# Patient Record
Sex: Female | Born: 1945
Health system: Southern US, Community
[De-identification: ages and names within clinical notes are randomized; demographics above are authoritative.]

## PROBLEM LIST (undated history)

## (undated) DIAGNOSIS — M1990 Unspecified osteoarthritis, unspecified site: Secondary | ICD-10-CM

## (undated) DIAGNOSIS — Z853 Personal history of malignant neoplasm of breast: Secondary | ICD-10-CM

## (undated) DIAGNOSIS — Z9889 Other specified postprocedural states: Secondary | ICD-10-CM

## (undated) DIAGNOSIS — R112 Nausea with vomiting, unspecified: Secondary | ICD-10-CM

## (undated) HISTORY — PX: TOTAL HIP ARTHROPLASTY: SHX124

## (undated) HISTORY — PX: APPENDECTOMY: SHX54

---

## 1997-07-12 ENCOUNTER — Encounter: Admission: RE | Admit: 1997-07-12 | Discharge: 1997-07-12 | Payer: Self-pay | Admitting: Family Medicine

## 1997-07-24 ENCOUNTER — Encounter: Admission: RE | Admit: 1997-07-24 | Discharge: 1997-07-24 | Payer: Self-pay | Admitting: Family Medicine

## 1999-01-06 ENCOUNTER — Encounter: Admission: RE | Admit: 1999-01-06 | Discharge: 1999-01-06 | Payer: Self-pay | Admitting: Family Medicine

## 1999-01-07 ENCOUNTER — Encounter: Payer: Self-pay | Admitting: Family Medicine

## 1999-01-07 ENCOUNTER — Encounter: Admission: RE | Admit: 1999-01-07 | Discharge: 1999-01-07 | Payer: Self-pay | Admitting: Family Medicine

## 1999-01-16 ENCOUNTER — Other Ambulatory Visit: Admission: RE | Admit: 1999-01-16 | Discharge: 1999-01-16 | Payer: Self-pay | Admitting: Family Medicine

## 1999-01-16 ENCOUNTER — Encounter: Admission: RE | Admit: 1999-01-16 | Discharge: 1999-01-16 | Payer: Self-pay | Admitting: Family Medicine

## 1999-02-13 ENCOUNTER — Encounter: Admission: RE | Admit: 1999-02-13 | Discharge: 1999-02-13 | Payer: Self-pay | Admitting: Family Medicine

## 1999-05-15 ENCOUNTER — Encounter: Admission: RE | Admit: 1999-05-15 | Discharge: 1999-05-15 | Payer: Self-pay | Admitting: Family Medicine

## 1999-07-31 ENCOUNTER — Encounter: Admission: RE | Admit: 1999-07-31 | Discharge: 1999-07-31 | Payer: Self-pay | Admitting: Family Medicine

## 2000-02-12 ENCOUNTER — Encounter: Admission: RE | Admit: 2000-02-12 | Discharge: 2000-02-12 | Payer: Self-pay | Admitting: Family Medicine

## 2000-02-12 ENCOUNTER — Encounter: Admission: RE | Admit: 2000-02-12 | Discharge: 2000-02-12 | Payer: Self-pay | Admitting: Sports Medicine

## 2000-02-12 ENCOUNTER — Encounter: Payer: Self-pay | Admitting: Sports Medicine

## 2000-03-01 ENCOUNTER — Encounter: Payer: Self-pay | Admitting: Family Medicine

## 2000-03-01 ENCOUNTER — Encounter: Admission: RE | Admit: 2000-03-01 | Discharge: 2000-03-01 | Payer: Self-pay | Admitting: Family Medicine

## 2000-09-19 ENCOUNTER — Encounter: Admission: RE | Admit: 2000-09-19 | Discharge: 2000-09-19 | Payer: Self-pay | Admitting: Family Medicine

## 2000-10-14 ENCOUNTER — Encounter (INDEPENDENT_AMBULATORY_CARE_PROVIDER_SITE_OTHER): Payer: Self-pay | Admitting: *Deleted

## 2000-10-19 ENCOUNTER — Encounter: Admission: RE | Admit: 2000-10-19 | Discharge: 2000-10-19 | Payer: Self-pay | Admitting: Family Medicine

## 2002-03-28 ENCOUNTER — Encounter: Admission: RE | Admit: 2002-03-28 | Discharge: 2002-03-28 | Payer: Self-pay | Admitting: Family Medicine

## 2002-04-11 ENCOUNTER — Encounter: Admission: RE | Admit: 2002-04-11 | Discharge: 2002-04-11 | Payer: Self-pay | Admitting: Family Medicine

## 2002-05-02 ENCOUNTER — Encounter: Admission: RE | Admit: 2002-05-02 | Discharge: 2002-05-02 | Payer: Self-pay | Admitting: Family Medicine

## 2002-05-14 ENCOUNTER — Encounter: Payer: Self-pay | Admitting: Family Medicine

## 2002-05-14 ENCOUNTER — Encounter: Admission: RE | Admit: 2002-05-14 | Discharge: 2002-05-14 | Payer: Self-pay | Admitting: Family Medicine

## 2002-08-01 ENCOUNTER — Encounter: Payer: Self-pay | Admitting: Emergency Medicine

## 2002-08-01 ENCOUNTER — Emergency Department (HOSPITAL_COMMUNITY): Admission: EM | Admit: 2002-08-01 | Discharge: 2002-08-01 | Payer: Self-pay | Admitting: Emergency Medicine

## 2003-05-12 ENCOUNTER — Emergency Department (HOSPITAL_COMMUNITY): Admission: EM | Admit: 2003-05-12 | Discharge: 2003-05-12 | Payer: Self-pay | Admitting: Emergency Medicine

## 2003-05-31 ENCOUNTER — Encounter: Admission: RE | Admit: 2003-05-31 | Discharge: 2003-05-31 | Payer: Self-pay | Admitting: Sports Medicine

## 2003-07-12 ENCOUNTER — Encounter: Admission: RE | Admit: 2003-07-12 | Discharge: 2003-07-12 | Payer: Self-pay | Admitting: Sports Medicine

## 2003-08-13 ENCOUNTER — Encounter: Admission: RE | Admit: 2003-08-13 | Discharge: 2003-08-13 | Payer: Self-pay | Admitting: Sports Medicine

## 2003-11-01 ENCOUNTER — Encounter: Admission: RE | Admit: 2003-11-01 | Discharge: 2003-11-01 | Payer: Self-pay | Admitting: Sports Medicine

## 2004-02-14 ENCOUNTER — Ambulatory Visit: Payer: Self-pay | Admitting: Sports Medicine

## 2004-04-09 ENCOUNTER — Ambulatory Visit: Payer: Self-pay | Admitting: Sports Medicine

## 2005-07-27 ENCOUNTER — Ambulatory Visit: Payer: Self-pay | Admitting: Sports Medicine

## 2005-09-07 ENCOUNTER — Ambulatory Visit: Payer: Self-pay | Admitting: Family Medicine

## 2005-11-09 ENCOUNTER — Ambulatory Visit: Payer: Self-pay | Admitting: Sports Medicine

## 2006-05-12 DIAGNOSIS — K5732 Diverticulitis of large intestine without perforation or abscess without bleeding: Secondary | ICD-10-CM | POA: Insufficient documentation

## 2006-05-12 DIAGNOSIS — M1990 Unspecified osteoarthritis, unspecified site: Secondary | ICD-10-CM | POA: Insufficient documentation

## 2006-05-12 DIAGNOSIS — F329 Major depressive disorder, single episode, unspecified: Secondary | ICD-10-CM | POA: Insufficient documentation

## 2006-05-12 DIAGNOSIS — F411 Generalized anxiety disorder: Secondary | ICD-10-CM | POA: Insufficient documentation

## 2006-05-12 DIAGNOSIS — F3289 Other specified depressive episodes: Secondary | ICD-10-CM | POA: Insufficient documentation

## 2006-05-12 DIAGNOSIS — N951 Menopausal and female climacteric states: Secondary | ICD-10-CM | POA: Insufficient documentation

## 2006-05-13 ENCOUNTER — Encounter (INDEPENDENT_AMBULATORY_CARE_PROVIDER_SITE_OTHER): Payer: Self-pay | Admitting: *Deleted

## 2009-05-09 ENCOUNTER — Encounter: Admission: RE | Admit: 2009-05-09 | Discharge: 2009-05-09 | Payer: Self-pay | Admitting: Family Medicine

## 2009-10-03 ENCOUNTER — Encounter: Admission: RE | Admit: 2009-10-03 | Discharge: 2009-10-03 | Payer: Self-pay | Admitting: Family Medicine

## 2009-11-19 ENCOUNTER — Ambulatory Visit: Payer: Self-pay | Admitting: Sports Medicine

## 2009-11-19 DIAGNOSIS — M19079 Primary osteoarthritis, unspecified ankle and foot: Secondary | ICD-10-CM | POA: Insufficient documentation

## 2009-12-03 ENCOUNTER — Encounter: Payer: Self-pay | Admitting: Sports Medicine

## 2009-12-24 ENCOUNTER — Ambulatory Visit: Payer: Self-pay | Admitting: Sports Medicine

## 2010-01-07 ENCOUNTER — Encounter: Payer: Self-pay | Admitting: Sports Medicine

## 2010-03-19 ENCOUNTER — Ambulatory Visit
Admission: RE | Admit: 2010-03-19 | Discharge: 2010-03-19 | Payer: Self-pay | Source: Home / Self Care | Attending: Sports Medicine | Admitting: Sports Medicine

## 2010-03-19 DIAGNOSIS — M7500 Adhesive capsulitis of unspecified shoulder: Secondary | ICD-10-CM | POA: Insufficient documentation

## 2010-03-19 DIAGNOSIS — M19019 Primary osteoarthritis, unspecified shoulder: Secondary | ICD-10-CM | POA: Insufficient documentation

## 2010-04-16 NOTE — Letter (Signed)
Summary: [facility]   [facility]   Imported By: Marily Memos 12/08/2009 13:31:20  _____________________________________________________________________  External Attachment:    Type:   Image     Comment:   External Document
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
## 2010-04-16 NOTE — Assessment & Plan Note (Signed)
Summary: f/u ankle,mc   Vital Signs:  Patient profile:   65 year old female Pulse rate:   86 / minute BP sitting:   138 / 94  (right arm)  Vitals Entered By: Rochele Pages RN (December 24, 2009 9:50 AM) CC: f/u rt ankle pain   CC:  f/u rt ankle pain.  History of Present Illness: Patient returns to clinic today to f/u on rt ankle pain.  Pain continues, hurting siginificantly today.   Wearing brace at all times except for while sleeping. Taking tramadol as needed, but states she does not feel this is very effective.   walking is difficult rainy days cause more ankle pain  She is willing to go to [facility] for opinion regarding ankle replacement or other surgery  Physical Exam  General:  Well-developed,well-nourished,in no acute distress; alert,appropriate and cooperative throughout examination Msk:  Rt ankle exam- Inversion normal Eversion lacking 10-15 degrees Plantar flexion 10 degree contracture, only 5 degrees additional platar flexion with pressure no dorsiflexion active or passive  cavus shape to foot overall  Gait shows dynamic genu valgus of RT leg walks with limited ankle motion almost as if fused gait does improve after walking for 2 to 3 minutes    Impression & Recommendations:  Problem # 1:  DEGENERATIVE JOINT DISEASE, ANKLE (ICD-715.97)  Her updated medication list for this problem includes:    Tramadol Hcl 50 Mg Tabs (Tramadol hcl) .Marland Kitchen... 1 tab two times a day to qid as needed pain  I would like her to see Dr Rosanne Ashing Diorio at [facility] for consideration of ankle replacement or other surgical options She remains very active and I think has potential for improvement in her activity level and her ADLs She has been a good athlete in younger years and rehabbed very well after RT hip replacement at [facility] I think whe will be motivated to work with them  Can return to me on a periodic basis for medical and other management  Complete Medication List: 1)  Tramadol Hcl 50 Mg  Tabs (Tramadol hcl) .Marland Kitchen.. 1 tab two times a day to qid as needed pain  Patient Instructions: 1)  Continue using tramadol at least twice daily, and as needed 2)  Try taking 2-9 grams of devil's claw. 3)  Discuss with orthopedic surgeon ankle fusion vs replacement. 4)  Do hip exercises regularly

## 2010-04-16 NOTE — Letter (Signed)
Summary: *Referral Letter  [facility]  [address]   [place], [place] [address]   Phone: [phone]  Fax: [phone]    12/24/2009 Dr. Fayrene Fearing Diorio [facility]   Dear Dr. Gates Rigg:  Thank you in advance for agreeing to see my patient:  [facility] [address] [place], [place]  [address]  Phone: [phone]  Reason for Referral: Teea is an active women who was very athletic in younger years.  She did gymnastics and other sports.  Both sons professional tennis players.  She had a lateral malleolar fracture that was never adequately treated and now has severe DJD of RT ankle that is limiting her life activity.  Procedures Requested: Your opinion of her as a candidate for Ankle replacement or other surgery for RT ankle.  Current Medical Problems: 1)  DEGENERATIVE JOINT DISEASE, ANKLE (ICD-715.97) 2)  MENOPAUSAL SYNDROME (ICD-627.2) 3)  DJD, UNSPECIFIED (ICD-715.90) - S/P replacment of RT Hip at [facility] 4)  DIVERTICULITIS OF COLON, NOS (ICD-562.11) 5)  DEPRESSIVE DISORDER, NOS (ICD-311) 6)  ANXIETY (ICD-300.00)   Current Medications: 1)  TRAMADOL HCL 50 MG  TABS (TRAMADOL HCL) 1 tab two to four times daily as needed  Past Medical History: 1)  chondromalacia of R hip s/p replacement, Dupetryn`s Contracture R 4th, fracture of rt lateral malleolus 2006/2007  Thank you again for agreeing to see our patient; please contact us if you have any further questions or need additional information.  Sincerely,   Army Melia FIELDS MD [phone].

## 2010-04-16 NOTE — Assessment & Plan Note (Signed)
Summary: PRE SURGERY DISCUSSION/MJD   Vital Signs:  Patient profile:   65 year old female BP sitting:   150 / 100  Vitals Entered By: Lillia Pauls CMA (March 19, 2010 2:50 PM)  CC:  ankle and neck pain.  History of Present Illness: ankle problems- right side, broken 5 years ago.  Did not have any ankle surgery at time of break.  NOw limitations of mobility and pain that is worse at the end of the day and when she sits edown after a long day.  uses ice and aspirin for pain.  tramadol didnt help.  using aircast all the time.    Seen at [facility] for consideration for ankle replacement.  Pt wants to discuss this with Dr. Darrick Penna.  neck- steadily getting worse, numbness down left arm.  worse at night.  now gets a pain where she was bitten by an insect. no weakness in hands unless lying on shoulder.   now gets left shoulder locking on full elevation or forward flexion  seen at [facility] and told that she had severe DJD changes in shoulder and mod ones in cervical spine  Pain at night in both locations that interrupts sleep.  Current Medications (verified): 1)  Tramadol Hcl 50 Mg  Tabs (Tramadol Hcl) .Marland Kitchen.. 1 Tab Two Times A Day To Qid As Needed Pain 2)  Vicodin 5-500 Mg Tabs (Hydrocodone-Acetaminophen) .... Take 1 By Mouth Two Times A Day  Allergies (verified): No Known Drug Allergies  Review of Systems  The patient denies fever, weight loss, and chest pain.    Physical Exam  General:  NADwell-developed, well-nourished, and well-hydrated.   Msk:  Neck: Full neck range of motion Grip strength and sensation normal in bilateral hands Strength good C4 to T1 distribution No sensory change to C4 to T1  left Shoulder: Inspection reveals no abnormalities, atrophy or asymmetry. Palpation is normal with no tenderness over AC joint or bicipital groove. ROM is limited to 115 degreesabduction, 100 anteriorly Rotator cuff strength normal throughout. No signs of impingement with negative Neer and  Hawkin's tests, empty can. Speeds and Yergason's tests normal. Normal scapular function observed. No painful arc and no drop arm sign. No apprehension sign   Rt ankle exam- Inversion normal Eversion lacking 10-15 degrees Plantar flexion 10 degree contracture, only 5 degrees additional platar flexion with pressure no dorsiflexion active or passive obvious deformity of lat malleolus     Additional Exam:  Xrays of shoulder large inf spur off humeral head loss of GH joint space/ sclerosis Left  Neck mild loss of disk space sclerosis C4 to C7   Impression & Recommendations:  Problem # 1:  DEGENERATIVE JOINT DISEASE, ANKLE (ICD-715.97) Assessment Unchanged discussed ankle replacement at [facility].  Recomend that she wait until she feels ready/ has the support she needs.  Vicodin for pain. Her updated medication list for this problem includes:    Tramadol Hcl 50 Mg Tabs (Tramadol hcl) .Marland Kitchen... 1 tab two times a day to qid as needed pain    Vicodin 5-500 Mg Tabs (Hydrocodone-acetaminophen) .Marland Kitchen... Take 1 by mouth two times a day  Problem # 2:  DEGENERATIVE JOINT DISEASE, LEFT SHOULDER (ICD-715.91) Assessment: New shoulder x ray shows bone spur and OA in joint.  May need shoulder replacement in future.  Will try to control pain with vicodin and amytriptiline at bedtime.  If this is not sufficient will consider shoulder injection at next visit. Her updated medication list for this problem includes:  Tramadol Hcl 50 Mg Tabs (Tramadol hcl) .Marland Kitchen... 1 tab two times a day to qid as needed pain    Vicodin 5-500 Mg Tabs (Hydrocodone-acetaminophen) .Marland Kitchen... Take 1 by mouth two times a day  Problem # 3:  FROZEN LEFT SHOULDER (ICD-726.0) Assessment: New provided ROM exercises for pt.  Pain medication for improved sleep at night.    reck 1 mo  Complete Medication List: 1)  Tramadol Hcl 50 Mg Tabs (Tramadol hcl) .Marland Kitchen.. 1 tab two times a day to qid as needed pain 2)  Vicodin 5-500 Mg Tabs  (Hydrocodone-acetaminophen) .... Take 1 by mouth two times a day  Patient Instructions: 1)  Work on range of motion exercises for shoulder 2)  take vicodin at night 3)  take 25mg  of amytriptiline at night 4)  wait for your joint replacements until you are ready, no harm in waiting Prescriptions: VICODIN 5-500 MG TABS (HYDROCODONE-ACETAMINOPHEN) take 1 by mouth two times a day  #60 x 0   Entered by:   Rochele Pages RN   Authorized by:   Enid Baas MD   Signed by:   Rochele Pages RN on 03/19/2010   Method used:   Print then Give to Patient   RxID:   [id number]    Orders Added: 1)  Est. Patient Level IV [14782]

## 2010-04-16 NOTE — Consult Note (Signed)
Summary: [facility]  [facility]   Imported By: Marily Memos 01/21/2010 09:13:38  _____________________________________________________________________  External Attachment:    Type:   Image     Comment:   External Document

## 2010-06-16 ENCOUNTER — Other Ambulatory Visit: Payer: Self-pay | Admitting: *Deleted

## 2010-06-16 MED ORDER — HYDROCODONE-ACETAMINOPHEN 5-500 MG PO TABS: ORAL_TABLET | ORAL | Status: DC

## 2010-07-01 ENCOUNTER — Ambulatory Visit (INDEPENDENT_AMBULATORY_CARE_PROVIDER_SITE_OTHER): Payer: BC Managed Care – HMO | Admitting: Sports Medicine

## 2010-07-01 ENCOUNTER — Encounter: Payer: Self-pay | Admitting: Sports Medicine

## 2010-07-01 DIAGNOSIS — M19019 Primary osteoarthritis, unspecified shoulder: Secondary | ICD-10-CM

## 2010-07-01 DIAGNOSIS — M7500 Adhesive capsulitis of unspecified shoulder: Secondary | ICD-10-CM

## 2010-07-01 DIAGNOSIS — M19079 Primary osteoarthritis, unspecified ankle and foot: Secondary | ICD-10-CM

## 2010-07-01 NOTE — Assessment & Plan Note (Signed)
See injection as above ROM tolerated Ok for vicodin as needed

## 2010-07-01 NOTE — Assessment & Plan Note (Addendum)
CSI today - ROM as tolerated - may need replacement at some point - avoid wt bearing overhead activites  Consent obtained and verified. Sterile betadine prep. Furthur cleansed with alcohol. Topical analgesic spray: Ethyl chloride. Joint: Lt GH Approached in typical fashion with:posterior aimed at coracoid Completed without difficulty Meds:1 cc kenalog 40 mg and 7 cc lidocaine 1% Needle:25 G 1.5 inch Aftercare instructions and Red flags advised.

## 2010-07-01 NOTE — Progress Notes (Signed)
  Subjective:    Patient ID: Kelly Hancock "Kelly Hancock, female    DOB: 1945/09/12, 65 y.o.   MRN: [id number]  HPI F/u Rt ankle and Lt shoulder  Rt ankle - pain is a little better.  Using brace some.  Has had to reschedule replacement with [facility] physician. Still unsure if insurance will cover everything.  Lt shoulder - Has some GH arthritis with possible frozen shoulder component.  Causing night time pain.  Has worked on ROM exercises.  Would like CSI today.   Review of Systems No F/S/C    Objective:   Physical Exam Gen: NAD Rt ankle: Severely limited ROM with dorsaflexion/plantarflexion/eversion/inversion.  + heterotopic bone/spurring on lateral ankle.  No obvious effusion today. Lt shoulder: ROM 150 deg f flexion and abduction. IR to buttock only, ER 30 deg.  + pain with ER and frozen shoudler test.  + popping and crepitus with all ROM.  RC strength intact.       Assessment & Plan:

## 2010-07-01 NOTE — Assessment & Plan Note (Signed)
-   vicodin as needed for pain - Gentle ROM as tolerated - f/u [facility] when ready for replacement

## 2010-07-28 ENCOUNTER — Ambulatory Visit (INDEPENDENT_AMBULATORY_CARE_PROVIDER_SITE_OTHER): Payer: BC Managed Care – HMO | Admitting: Family Medicine

## 2010-07-28 VITALS — BP 123/86 | HR 85

## 2010-07-28 DIAGNOSIS — M24419 Recurrent dislocation, unspecified shoulder: Secondary | ICD-10-CM

## 2010-07-28 DIAGNOSIS — M19019 Primary osteoarthritis, unspecified shoulder: Secondary | ICD-10-CM

## 2010-07-28 DIAGNOSIS — M25519 Pain in unspecified shoulder: Secondary | ICD-10-CM

## 2010-07-28 MED ORDER — DIAZEPAM 5 MG PO TABS: 5.0000 mg | ORAL_TABLET | Freq: Three times a day (TID) | ORAL | Status: AC | PRN

## 2010-07-28 NOTE — Patient Instructions (Addendum)
Sling for 2 weeks  Go to xray  Referral to Dr. Ave Filter, Shoulder surgeon on Wednesday , Aug 05, 2010 at 1030.  [address] [place], [place] [phone]

## 2010-07-29 ENCOUNTER — Other Ambulatory Visit: Payer: Self-pay | Admitting: Family Medicine

## 2010-07-29 ENCOUNTER — Ambulatory Visit
Admission: RE | Admit: 2010-07-29 | Discharge: 2010-07-29 | Disposition: A | Discharge status: Home / Self Care | Payer: BC Managed Care – HMO | Source: Ambulatory Visit | Attending: Family Medicine | Admitting: Family Medicine

## 2010-07-29 ENCOUNTER — Ambulatory Visit: Admission: RE | Admit: 2010-07-29 | Payer: BC Managed Care – HMO | Source: Ambulatory Visit

## 2010-07-29 DIAGNOSIS — M25519 Pain in unspecified shoulder: Secondary | ICD-10-CM

## 2010-07-29 DIAGNOSIS — M24419 Recurrent dislocation, unspecified shoulder: Secondary | ICD-10-CM | POA: Insufficient documentation

## 2010-07-29 NOTE — Progress Notes (Signed)
65 year old female who I am asked to see acutely who "threw her shoulder out" last night and has not been able to reduce on her own. Patient and her family are well known to the [facility] staff. She is a former highly Publishing copy with significant congenital laxity, end stage ankle arthritis, who recently had multi-plane loss of motion c/w adhesive capsulitis on the L and had an intraarticular injection of corticosteroid 4-5 weeks ago.  Since then, her ROM has improved and she has had notable improved ROM until yesterday, DOI 07/27/2010.  Per her report, shoulder had repetitively subluxed many times throughout life, but additionally now there "is a crunching sound".  Today, she is bent over, arm bent at 90 deg at elbow, protecting L arm.  Patient Active Problem List  Diagnoses  . ANXIETY  . DEPRESSIVE DISORDER, NOS  . DIVERTICULITIS OF COLON, NOS  . MENOPAUSAL SYNDROME  . DJD, UNSPECIFIED  . DEGENERATIVE JOINT DISEASE, ANKLE  . DEGENERATIVE JOINT DISEASE, LEFT SHOULDER  . FROZEN LEFT SHOULDER  . Shoulder dislocation, recurrent   No past medical history on file. No past surgical history on file. History  Substance Use Topics  . Smoking status: Never Smoker   . Smokeless tobacco: Not on file  . Alcohol Use: Not on file   No family history on file. Allergies  Allergen Reactions  . Demerol    Current Outpatient Prescriptions on File Prior to Visit  Medication Sig Dispense Refill  . HYDROcodone-acetaminophen (VICODIN) 5-500 MG per tablet 1 tab two times a day to qid as needed pain  30 tablet  0   REVIEW OF SYSTEMS  GEN: No fevers, chills. Nontoxic. Primarily MSK c/o today. MSK: Detailed in the HPI GI: tolerating PO intake without difficulty Otherwise the pertinent positives of the ROS are noted above.   GEN: WDWN, NAD, appears in acute pain. HEENT: Atraumatic, Normocephalic. Neck supple. No masses, No LAD. Ears and Nose: No external deformity. EXTR: No c/c/e NEURO Normal  gait.  PSYCH: Normally interactive. Conversant. Not depressed or anxious appearing.   R shoulder full ROM  L shoulder. NT along clavicle. Anterior, glenohumeral head is palpated in anterior local compared to contralateral side. Posterior loss of congruity is present. Minimally able to flex or abduct the shoulder. Sensation and grip are intact.  After manipulation, in prone position, holding GH head in place, flexion, abduction appreciated with good integrity of GH location.  A/P: 1. Severe OA L GH joint 2. Dislocation, L shoulder: by feel, as if subluxed without reduction and without a great deal of anterior translation of GH head  Manipulation, L shoulder Anesthesia: Prepped with betadine, ethyl chloride for anesthesia. 10 cc of Lidocaine 1% instilled into the [facility] joint via posterior approach with good effect. Diminished pain post-injection. Patient subsequently placed prone on exam table with traction placed on arm. Translation and shift felt at anterior displacement with good relocation and dramatically improved ROM post reduction.  Placed in sling - 2 weeks  Complex shoulder case, check xrays  At this time, significant GH OA. I am going to consult Dr. Ave Filter for his expertise in this case.

## 2010-10-01 ENCOUNTER — Other Ambulatory Visit: Payer: Self-pay | Admitting: *Deleted

## 2010-10-01 MED ORDER — HYDROCODONE-ACETAMINOPHEN 5-500 MG PO TABS: ORAL_TABLET | ORAL | Status: DC

## 2011-04-30 ENCOUNTER — Other Ambulatory Visit: Payer: Self-pay | Admitting: Family Medicine

## 2011-04-30 DIAGNOSIS — Z780 Asymptomatic menopausal state: Secondary | ICD-10-CM

## 2011-04-30 DIAGNOSIS — Z1231 Encounter for screening mammogram for malignant neoplasm of breast: Secondary | ICD-10-CM

## 2011-07-21 ENCOUNTER — Other Ambulatory Visit: Payer: BC Managed Care – HMO

## 2011-07-21 ENCOUNTER — Ambulatory Visit: Payer: BC Managed Care – HMO

## 2011-09-08 ENCOUNTER — Ambulatory Visit: Payer: BC Managed Care – HMO

## 2011-09-08 ENCOUNTER — Other Ambulatory Visit: Payer: BC Managed Care – HMO

## 2011-10-12 ENCOUNTER — Encounter: Payer: Self-pay | Admitting: Sports Medicine

## 2011-10-12 ENCOUNTER — Ambulatory Visit (INDEPENDENT_AMBULATORY_CARE_PROVIDER_SITE_OTHER): Payer: BC Managed Care – HMO | Admitting: Sports Medicine

## 2011-10-12 VITALS — BP 150/101 | HR 89

## 2011-10-12 DIAGNOSIS — M19019 Primary osteoarthritis, unspecified shoulder: Secondary | ICD-10-CM

## 2011-10-12 DIAGNOSIS — M19079 Primary osteoarthritis, unspecified ankle and foot: Secondary | ICD-10-CM

## 2011-10-12 MED ORDER — HYDROCODONE-ACETAMINOPHEN 5-500 MG PO TABS: ORAL_TABLET | ORAL | Status: DC

## 2011-10-12 MED ORDER — DIAZEPAM 5 MG PO TABS: 5.0000 mg | ORAL_TABLET | Freq: Three times a day (TID) | ORAL | Status: DC | PRN

## 2011-10-12 NOTE — Assessment & Plan Note (Signed)
We gave her a compression sleeve today to use for her ankle to keep down the swelling  Restart hydrocodone 5/500  Go ahead and use this 2-3 times a day as she only used 30 in the last year  I think this would make her more capable of walking  I will see her in 6 weeks

## 2011-10-12 NOTE — Assessment & Plan Note (Signed)
Shoulder limitation seems extreme  I think she would need a surgical shoulder replacement to improve this  At present we will give her diazepam 5 mg to take 1 or 2 at night to see if she can sleep through the night without spasm  I will follow until she wants surgery
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
## 2011-11-17 ENCOUNTER — Ambulatory Visit (INDEPENDENT_AMBULATORY_CARE_PROVIDER_SITE_OTHER): Payer: BC Managed Care – HMO | Admitting: Sports Medicine

## 2011-11-17 VITALS — BP 146/80

## 2011-11-17 DIAGNOSIS — M19079 Primary osteoarthritis, unspecified ankle and foot: Secondary | ICD-10-CM

## 2011-11-17 DIAGNOSIS — M7500 Adhesive capsulitis of unspecified shoulder: Secondary | ICD-10-CM

## 2011-11-17 MED ORDER — HYDROCODONE-ACETAMINOPHEN 5-500 MG PO TABS: ORAL_TABLET | ORAL | Status: DC

## 2011-11-17 MED ORDER — AMITRIPTYLINE HCL 25 MG PO TABS: 25.0000 mg | ORAL_TABLET | Freq: Every day | ORAL | Status: DC

## 2011-11-17 NOTE — Patient Instructions (Addendum)
Please start amitriptyline 25 mg at bedtime  Take hydrocodone twice daily  Ok to continue diazepam at bedtime  Please follow up in 3 months  Thank you for seeing [place] today!
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
## 2011-11-17 NOTE — Assessment & Plan Note (Signed)
She has seen Dr Ave Filter  Probably a candidate for reconstructive surgery  Cont Valium for relaxation of mm spasm Add amitriptyline to potentiate pain relief  Reck 3 mos  I think surgery is not a good option at this time because of other family stressors

## 2011-11-17 NOTE — Assessment & Plan Note (Signed)
This remains severe but her gait is improves with [employer] shoes  Cont to use compression most of time as swelling is improved  I think she needs to use more pain med to be functional

## 2012-03-06 ENCOUNTER — Encounter: Payer: Self-pay | Admitting: Sports Medicine

## 2012-03-06 ENCOUNTER — Ambulatory Visit (INDEPENDENT_AMBULATORY_CARE_PROVIDER_SITE_OTHER): Payer: BC Managed Care – HMO | Admitting: Sports Medicine

## 2012-03-06 VITALS — BP 144/84 | Ht 65.0 in

## 2012-03-06 DIAGNOSIS — M19019 Primary osteoarthritis, unspecified shoulder: Secondary | ICD-10-CM

## 2012-03-06 DIAGNOSIS — M19079 Primary osteoarthritis, unspecified ankle and foot: Secondary | ICD-10-CM

## 2012-03-06 MED ORDER — DIAZEPAM 5 MG PO TABS: 5.0000 mg | ORAL_TABLET | Freq: Three times a day (TID) | ORAL | Status: DC | PRN

## 2012-03-06 NOTE — Assessment & Plan Note (Signed)
Continue air cast with activity, followed by compression sleeve for a few hours after activity. Continue anti-inflammatory medication. Evaluated by [facility] in the past, but no surgery at this time. Support is key for her, and she seems to be doing well with that.

## 2012-03-06 NOTE — Patient Instructions (Signed)
Continue to do balance exercises alternating feet. Continue to wear your Aircast with a lot of walking/standing. Use the compression sleeve for about an hour afterwards. Easy motion with ankle without forcing it past any real restrictions.  For your shoulder, please restart the Amitriptyline nightly to help with sleep.

## 2012-03-06 NOTE — Progress Notes (Signed)
  Subjective:    Patient ID: Kelly Hancock, female    DOB: 1945-12-09, 66 y.o.   MRN: [id number]  HPI  Pt follow up for left shoulder pain and right ankle pain  1. Shoulder- Patient with frozen shoulder likely secondary to arthritis. She was evaluted by Dr. Ave Filter at [facility] but at this time she does not feel that surgery is right for her. She has been doing ROM exercises and has improved her ROM. She also takes Valium at night to sleep, but not the Amitriptyline because she drinks wine nightly and did not want it to interact.  2. Ankle- Patient reports the ankle has not worsened, except she had an incident this morning where she stepped off the porch wrong and her ankle twisted. She had on her air cast which protected her some but she still feels a little discomfort. She typically wears the air cast or compression sleeve for comfort. She takes some ASA which helps with this imflammation.    Review of Systems Negative except as mentioned in HPI above    Objective:   Physical Exam  Gen: Awake, alert, NAD  Shoulder:  Inspection reveals no abnormalities. TTP anterior aspect of shoulder. Crepitus with ROM. ROM is decreased in all planes, but improved. Very limited back scratch Rotator cuff strength normal with good stability below 70 deg of abduction No apprehension sign  Ankle: Aircast in place. Swelling over lateral malleolus. TTP lateral ankle and lateral foot. Decreased ROM in all directions. No redness or bruising.  Gait without limp, compensating well for loss of function of ankle.      Assessment & Plan:

## 2012-03-06 NOTE — Assessment & Plan Note (Signed)
ROM improving with exercises. Continue aspirin, Valium and will restart Amitriptyline to help with rest at night. Will RTC in a few months, or as needed if anything changes.

## 2012-03-27 ENCOUNTER — Other Ambulatory Visit: Payer: Self-pay | Admitting: Sports Medicine

## 2014-08-29 ENCOUNTER — Encounter: Payer: Self-pay | Admitting: Sports Medicine

## 2014-08-29 ENCOUNTER — Ambulatory Visit (INDEPENDENT_AMBULATORY_CARE_PROVIDER_SITE_OTHER): Payer: Medicare Other | Admitting: Sports Medicine

## 2014-08-29 VITALS — BP 158/90 | HR 91 | Ht 65.0 in

## 2014-08-29 DIAGNOSIS — M25561 Pain in right knee: Secondary | ICD-10-CM

## 2014-08-29 DIAGNOSIS — M1731 Unilateral post-traumatic osteoarthritis, right knee: Secondary | ICD-10-CM | POA: Insufficient documentation

## 2014-08-29 DIAGNOSIS — M129 Arthropathy, unspecified: Secondary | ICD-10-CM

## 2014-08-29 DIAGNOSIS — M1711 Unilateral primary osteoarthritis, right knee: Secondary | ICD-10-CM

## 2014-08-29 MED ORDER — METHYLPREDNISOLONE ACETATE 40 MG/ML IJ SUSP
40.0000 mg | Freq: Once | INTRAMUSCULAR | Status: AC
  Administered 2014-08-29: 40 mg via INTRA_ARTICULAR

## 2014-08-29 NOTE — Progress Notes (Signed)
Patient ID: Kelly Hancock, female   DOB: 16-May-1945, 69 y.o.   MRN: [id number]  Patient had a busy weekend with serving a couple of dinners On her feet for much of time Then went to pool and was more active and doing some exercises Knee a bit sore Woke up next day with severe swelling and pain RT knee No fall or specific trauma  RT leg has severe DJD of ankle from untreated Fx  THR on RT  Exam Pleasant and NAD BP 158/90 mmHg  Pulse 91  Ht 5\' 5"  (1.651 m)  Knee shows an effusion in SPP Extension is full Flex is painful by 90 deg Ligament testing intact Unable to do Mcmurray 2/2 pain  Walks with limp  [place] of RT Knee Large SPP effusion At distal femur large spur is seen This cuts into soft tissue and into deep surface of QT Meniscii look OK with no swelling along joint line

## 2014-08-29 NOTE — Assessment & Plan Note (Signed)
Compression sleeve for swelling and stability  Procedure:  Injection of RT suprapatellar pouch Consent obtained and verified. Time-out conducted. Noted no overlying erythema, induration, or other signs of local infection. Skin prepped in a sterile fashion. Topical analgesic spray: Ethyl chloride. Completed without difficulty. SPP pouch identified on US/ woith visualization I injected the pouch without difficulty from lateral suprapatellar position Meds: 40 mgm solumedrol + 4 cc lidocaine 1% Pain immediately improved suggesting accurate placement of the medication. Advised to call if fevers/chills, erythema, induration, drainage, or persistent bleeding.  Use compression when up Start with some ibuprofen or aleve daily Easy motion  Reck in 2 wks

## 2014-08-29 NOTE — Patient Instructions (Signed)
Take aleve or ibuprofen to help with inflammation on your knee. Take 2 tablets twice a day for 7-10 days then as needed.  Wear the compression sleeve on your right knee during activity or at least a few hours during the day.  Try to elevate your knee if swelling or pain Don't sleep in the sleeve.

## 2014-09-12 ENCOUNTER — Encounter: Payer: Self-pay | Admitting: Sports Medicine

## 2014-09-12 ENCOUNTER — Ambulatory Visit (INDEPENDENT_AMBULATORY_CARE_PROVIDER_SITE_OTHER): Payer: Medicare Other | Admitting: Sports Medicine

## 2014-09-12 VITALS — BP 155/75 | Ht 66.0 in | Wt 140.0 lb

## 2014-09-12 DIAGNOSIS — M1731 Unilateral post-traumatic osteoarthritis, right knee: Secondary | ICD-10-CM

## 2014-09-12 DIAGNOSIS — M129 Arthropathy, unspecified: Secondary | ICD-10-CM

## 2014-09-12 DIAGNOSIS — M1711 Unilateral primary osteoarthritis, right knee: Secondary | ICD-10-CM

## 2014-09-12 DIAGNOSIS — M19071 Primary osteoarthritis, right ankle and foot: Secondary | ICD-10-CM

## 2014-09-12 MED ORDER — HYDROCODONE-ACETAMINOPHEN 5-325 MG PO TABS: 1.0000 | ORAL_TABLET | Freq: Two times a day (BID) | ORAL | Status: DC | PRN

## 2014-09-12 MED ORDER — NORTRIPTYLINE HCL 25 MG PO CAPS: 25.0000 mg | ORAL_CAPSULE | Freq: Every day | ORAL | Status: DC
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
## 2014-10-03 ENCOUNTER — Ambulatory Visit: Payer: Medicare Other | Admitting: Sports Medicine

## 2014-11-07 ENCOUNTER — Ambulatory Visit: Payer: Medicare Other | Admitting: Sports Medicine

## 2014-11-19 ENCOUNTER — Ambulatory Visit
Admission: RE | Admit: 2014-11-19 | Discharge: 2014-11-19 | Disposition: A | Discharge status: Home / Self Care | Payer: Medicare Other | Source: Ambulatory Visit | Attending: Sports Medicine | Admitting: Sports Medicine

## 2014-11-19 DIAGNOSIS — M1711 Unilateral primary osteoarthritis, right knee: Secondary | ICD-10-CM

## 2014-11-28 ENCOUNTER — Encounter: Payer: Self-pay | Admitting: Sports Medicine

## 2014-11-28 ENCOUNTER — Ambulatory Visit (INDEPENDENT_AMBULATORY_CARE_PROVIDER_SITE_OTHER): Payer: Medicare Other | Admitting: Sports Medicine

## 2014-11-28 VITALS — BP 136/79 | Ht 66.0 in | Wt 140.0 lb

## 2014-11-28 DIAGNOSIS — M19071 Primary osteoarthritis, right ankle and foot: Secondary | ICD-10-CM | POA: Diagnosis not present

## 2014-11-28 DIAGNOSIS — M1731 Unilateral post-traumatic osteoarthritis, right knee: Secondary | ICD-10-CM | POA: Diagnosis not present

## 2014-11-28 MED ORDER — DIAZEPAM 5 MG PO TABS: 5.0000 mg | ORAL_TABLET | Freq: Three times a day (TID) | ORAL | Status: DC | PRN

## 2014-11-28 NOTE — Assessment & Plan Note (Signed)
Cont to use compression sleeve for this  Wear good shoe support

## 2014-11-28 NOTE — Progress Notes (Signed)
Patient ID: Kelly Hancock, female   DOB: 21-Nov-1945, 69 y.o.   MRN: [id number]  Patient retuns for f/u of RT knee pain Has had some improvement in pain pattern Uses compression sleeve and this helps Less swelling than 3 mos ago No redness  Pain is worse with deep bend Pain increases with standing too long No locking or giving way  RT ankle - remote fracture and now DJD  RT hip S/P THR and had some pain when knee hurts  ROS: no recnt infections/ no fever/ no chills/ no swelling in hand joints/ other systems unremarkable  PEXAM Nad BP 136/79 mmHg  Ht 5\' 6"  (1.676 m)  Wt 140 lb (63.504 kg)  BMI 22.61 kg/m2  RT knee Lacks about 2 to 3 deg full ext Flexion now to 140 Crepitation under patella/ increase with flex and Ext Mcmurray neg Ligaments stable No swelling No redness RT hop with good ROM RT ankle with DJD changes  Gait is improved and less antalgic  XR shows a lot of PF compt DJD/ med and lat compt only mild changes

## 2014-11-28 NOTE — Assessment & Plan Note (Signed)
Lookd better today  kee HEP with SLR in all directions  Avoid deep bend  Use compression sleeve with walking

## 2015-01-14 ENCOUNTER — Other Ambulatory Visit: Payer: Self-pay | Admitting: Sports Medicine

## 2015-01-20 ENCOUNTER — Other Ambulatory Visit: Payer: Self-pay | Admitting: *Deleted

## 2015-01-20 DIAGNOSIS — M1711 Unilateral primary osteoarthritis, right knee: Secondary | ICD-10-CM

## 2015-01-20 MED ORDER — NORTRIPTYLINE HCL 25 MG PO CAPS: 25.0000 mg | ORAL_CAPSULE | Freq: Every day | ORAL | Status: DC

## 2015-07-17 ENCOUNTER — Other Ambulatory Visit: Payer: Self-pay | Admitting: Family Medicine

## 2015-07-17 DIAGNOSIS — Z1231 Encounter for screening mammogram for malignant neoplasm of breast: Secondary | ICD-10-CM

## 2015-07-17 DIAGNOSIS — M810 Age-related osteoporosis without current pathological fracture: Secondary | ICD-10-CM

## 2015-08-13 ENCOUNTER — Ambulatory Visit
Admission: RE | Admit: 2015-08-13 | Discharge: 2015-08-13 | Disposition: A | Discharge status: Home / Self Care | Payer: Medicare Other | Source: Ambulatory Visit | Attending: Family Medicine | Admitting: Family Medicine

## 2015-08-13 DIAGNOSIS — Z1231 Encounter for screening mammogram for malignant neoplasm of breast: Secondary | ICD-10-CM

## 2015-08-15 ENCOUNTER — Other Ambulatory Visit: Payer: Self-pay | Admitting: Family Medicine

## 2015-08-15 DIAGNOSIS — R928 Other abnormal and inconclusive findings on diagnostic imaging of breast: Secondary | ICD-10-CM

## 2015-08-20 ENCOUNTER — Other Ambulatory Visit: Payer: Self-pay | Admitting: Family Medicine

## 2015-08-20 ENCOUNTER — Ambulatory Visit
Admission: RE | Admit: 2015-08-20 | Discharge: 2015-08-20 | Disposition: A | Discharge status: Home / Self Care | Payer: Medicare Other | Source: Ambulatory Visit | Attending: Family Medicine | Admitting: Family Medicine

## 2015-08-20 DIAGNOSIS — R928 Other abnormal and inconclusive findings on diagnostic imaging of breast: Secondary | ICD-10-CM

## 2015-08-20 DIAGNOSIS — N6489 Other specified disorders of breast: Secondary | ICD-10-CM

## 2015-08-20 DIAGNOSIS — N6320 Unspecified lump in the left breast, unspecified quadrant: Secondary | ICD-10-CM

## 2015-08-25 ENCOUNTER — Telehealth: Payer: Self-pay | Admitting: *Deleted

## 2015-08-25 NOTE — Telephone Encounter (Signed)
Called pt to schedule for [facility] on 08/27/15. Pt relate she would like to see Dr. Excell Seltzer in the AM clinic. Informed pt that AM clinic is full and that I would call [facility] to get an appt for pt to see Dr. Excell Seltzer. Was given appt for 08/28/15 at 9:30 to see Dr. Excell Seltzer in his office at [place]. Confirmed appt date and time with pt to see Dr. Excell Seltzer at [place]. Informed pt she did not need to attend [facility] on 08/27/15. Gave pt contact information for questions or needs.

## 2015-08-28 ENCOUNTER — Encounter: Payer: Self-pay | Admitting: Radiation Oncology

## 2015-08-28 ENCOUNTER — Other Ambulatory Visit: Payer: Self-pay | Admitting: General Surgery

## 2015-08-28 DIAGNOSIS — C50912 Malignant neoplasm of unspecified site of left female breast: Secondary | ICD-10-CM

## 2015-09-02 ENCOUNTER — Encounter: Payer: Self-pay | Admitting: Radiation Oncology

## 2015-09-02 NOTE — Progress Notes (Signed)
Location of Breast Cancer: Left Breast  Histology per Pathology Report:  Diagnosis Breast, left, needle core biopsy, 2 o'clock - INVASIVE DUCTAL CARCINOMA.  Grade II invasive ductal carcinoma in the Left Breast  Receptor Status: ER(100%), PR (50%), Her2-neu (NEG), Ki-(12%)  Did patient present with symptoms or was this found on screening mammography?: It was found on a screening mammogram.   Past/Anticipated interventions by surgeon, if any: Dr. Excell Seltzer plans to perform a Radioactive seed localized Left Breast Lumpectomy and Left Axillary Sentinel Lymph node biospy. She reports she needs to call next week. She states they want to have her continue PT because of a history of injury to her Left Shoulder.   Past/Anticipated interventions by medical oncology, if any: She has been referred to Medical Oncology, no appointment scheduled at this time.   Lymphedema issues, if any:  N/A  Pain issues, if any:  She reports bilateral heel pain which has started in the past few weeks.   SAFETY ISSUES:  Prior radiation? No  Pacemaker/ICD? No  Possible current pregnancy? No  Is the patient on methotrexate? No  Current Complaints / other details: She cares for her daughter at home, who is ill. Her grandchild is also at her home.   Pregnancy/ Birth Age at Menarche: 16 years Age of menopause: 51-55 years Contraceptive History: Oral Contraceptives Gravida: 3 Maternal Age: 54-30 Para: 3   BP 141/82 mmHg  Pulse 83  Temp(Src) 98.2 F (36.8 C)  Ht 5' 6"  (1.676 m)  Wt 158 lb 14.4 oz (72.077 kg)  BMI 25.66 kg/m2  SpO2 97%    Wt Readings from Last 3 Encounters:  09/05/15 158 lb 14.4 oz (72.077 kg)  11/28/14 140 lb (63.504 kg)  09/12/14 140 lb (63.504 kg)   Joseangel Nettleton, Stephani Police, RN 09/02/2015,9:37 AM

## 2015-09-03 ENCOUNTER — Ambulatory Visit: Payer: Medicare Other | Attending: General Surgery | Admitting: Physical Therapy

## 2015-09-03 DIAGNOSIS — M25612 Stiffness of left shoulder, not elsewhere classified: Secondary | ICD-10-CM | POA: Diagnosis not present

## 2015-09-03 DIAGNOSIS — M6281 Muscle weakness (generalized): Secondary | ICD-10-CM | POA: Diagnosis present
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
## 2015-09-05 ENCOUNTER — Ambulatory Visit
Admission: RE | Admit: 2015-09-05 | Discharge: 2015-09-05 | Disposition: A | Discharge status: Home / Self Care | Payer: Medicare Other | Source: Ambulatory Visit | Attending: Radiation Oncology | Admitting: Radiation Oncology

## 2015-09-05 ENCOUNTER — Encounter: Payer: Self-pay | Admitting: Radiation Oncology

## 2015-09-05 VITALS — BP 141/82 | HR 83 | Temp 98.2°F | Ht 66.0 in | Wt 158.9 lb

## 2015-09-05 DIAGNOSIS — C50412 Malignant neoplasm of upper-outer quadrant of left female breast: Secondary | ICD-10-CM | POA: Diagnosis not present

## 2015-09-05 DIAGNOSIS — Z79899 Other long term (current) drug therapy: Secondary | ICD-10-CM | POA: Diagnosis not present

## 2015-09-05 NOTE — Addendum Note (Signed)
Encounter addended by: Ernst Spell, RN on: 09/05/2015 10:36 AM<BR>     Documentation filed: Charges VN

## 2015-09-09 ENCOUNTER — Encounter: Payer: Self-pay | Admitting: Hematology

## 2015-09-09 ENCOUNTER — Telehealth: Payer: Self-pay | Admitting: Hematology

## 2015-09-09 NOTE — Telephone Encounter (Signed)
Appointment scheduled with Burr Medico on 6/30 at 1:30pm with an arrival at 1pm.. Patient agreed and demographics were verified. Letter to referring.

## 2015-09-10 ENCOUNTER — Ambulatory Visit: Payer: Medicare Other | Admitting: Physical Therapy

## 2015-09-10 ENCOUNTER — Telehealth: Payer: Self-pay | Admitting: *Deleted

## 2015-09-10 DIAGNOSIS — M6281 Muscle weakness (generalized): Secondary | ICD-10-CM

## 2015-09-10 DIAGNOSIS — M25612 Stiffness of left shoulder, not elsewhere classified: Secondary | ICD-10-CM | POA: Diagnosis not present
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
## 2015-09-10 NOTE — Telephone Encounter (Signed)
Received appt date/time from [facility].  Placed note for an intake form to be given to the pt at time of check in.

## 2015-09-12 ENCOUNTER — Ambulatory Visit: Payer: Medicare Other | Admitting: Physical Therapy

## 2015-09-12 ENCOUNTER — Telehealth: Payer: Self-pay | Admitting: *Deleted

## 2015-09-12 ENCOUNTER — Ambulatory Visit (HOSPITAL_BASED_OUTPATIENT_CLINIC_OR_DEPARTMENT_OTHER): Payer: Medicare Other | Admitting: Hematology

## 2015-09-12 ENCOUNTER — Ambulatory Visit (HOSPITAL_BASED_OUTPATIENT_CLINIC_OR_DEPARTMENT_OTHER): Payer: Medicare Other

## 2015-09-12 ENCOUNTER — Encounter: Payer: Self-pay | Admitting: Hematology

## 2015-09-12 ENCOUNTER — Telehealth: Payer: Self-pay | Admitting: Hematology

## 2015-09-12 VITALS — BP 156/88 | HR 82 | Temp 98.3°F | Resp 18 | Ht 66.0 in | Wt 156.5 lb

## 2015-09-12 DIAGNOSIS — M25612 Stiffness of left shoulder, not elsewhere classified: Secondary | ICD-10-CM | POA: Diagnosis not present

## 2015-09-12 DIAGNOSIS — Z170 Estrogen receptor positive status [ER+]: Secondary | ICD-10-CM

## 2015-09-12 DIAGNOSIS — C50412 Malignant neoplasm of upper-outer quadrant of left female breast: Secondary | ICD-10-CM

## 2015-09-12 DIAGNOSIS — M6281 Muscle weakness (generalized): Secondary | ICD-10-CM

## 2015-09-12 LAB — COMPREHENSIVE METABOLIC PANEL
ALT: 31 U/L (ref 0–55)
AST: 22 U/L (ref 5–34)
Albumin: 4 g/dL (ref 3.5–5.0)
Alkaline Phosphatase: 67 U/L (ref 40–150)
Anion Gap: 9 mEq/L (ref 3–11)
BUN: 13.2 mg/dL (ref 7.0–26.0)
CHLORIDE: 107 meq/L (ref 98–109)
CO2: 25 meq/L (ref 22–29)
CREATININE: 0.7 mg/dL (ref 0.6–1.1)
Calcium: 10 mg/dL (ref 8.4–10.4)
EGFR: 84 mL/min/{1.73_m2} — ABNORMAL LOW (ref >90)
GLUCOSE: 99 mg/dL (ref 70–140)
Potassium: 4.2 mEq/L (ref 3.5–5.1)
Sodium: 141 mEq/L (ref 136–145)
TOTAL PROTEIN: 7.3 g/dL (ref 6.4–8.3)

## 2015-09-12 LAB — CBC WITH DIFFERENTIAL/PLATELET
BASO%: 0.8 % (ref 0.0–2.0)
Basophils Absolute: 0 10*3/uL (ref 0.0–0.1)
EOS%: 2.3 % (ref 0.0–7.0)
Eosinophils Absolute: 0.1 10*3/uL (ref 0.0–0.5)
HCT: 41.7 % (ref 34.8–46.6)
HGB: 13.6 g/dL (ref 11.6–15.9)
LYMPH#: 1.6 10*3/uL (ref 0.9–3.3)
LYMPH%: 28.1 % (ref 14.0–49.7)
MCH: 26.6 pg (ref 25.1–34.0)
MCHC: 32.5 g/dL (ref 31.5–36.0)
MCV: 81.9 fL (ref 79.5–101.0)
MONO#: 0.6 10*3/uL (ref 0.1–0.9)
MONO%: 10.3 % (ref 0.0–14.0)
NEUT%: 58.5 % (ref 38.4–76.8)
NEUTROS ABS: 3.4 10*3/uL (ref 1.5–6.5)
PLATELETS: 231 10*3/uL (ref 145–400)
RBC: 5.1 10*6/uL (ref 3.70–5.45)
RDW: 13.1 % (ref 11.2–14.5)
WBC: 5.9 10*3/uL (ref 3.9–10.3)

## 2015-09-12 NOTE — Telephone Encounter (Signed)
  Oncology Nurse Navigator Documentation  Navigator Location: [facility] (09/12/15 1600) Navigator Encounter Type: Introductory phone call (09/12/15 1600)                                          Time Spent with Patient: 15 (09/12/15 1600)

## 2015-09-12 NOTE — Patient Instructions (Signed)
[email or web address] Lymphatic flow series with Shoosh Lettick Crotzer 
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
## 2015-09-12 NOTE — Telephone Encounter (Signed)
per pof to sch pt appt-sent pt back to lab-gave avs

## 2015-09-13 ENCOUNTER — Encounter: Payer: Self-pay | Admitting: Hematology

## 2015-09-13 DIAGNOSIS — Z853 Personal history of malignant neoplasm of breast: Secondary | ICD-10-CM

## 2015-09-13 HISTORY — DX: Personal history of malignant neoplasm of breast: Z85.3

## 2015-09-17 ENCOUNTER — Ambulatory Visit: Payer: Medicare Other | Attending: General Surgery | Admitting: Physical Therapy

## 2015-09-17 DIAGNOSIS — M6281 Muscle weakness (generalized): Secondary | ICD-10-CM | POA: Insufficient documentation

## 2015-09-17 DIAGNOSIS — M25612 Stiffness of left shoulder, not elsewhere classified: Secondary | ICD-10-CM | POA: Insufficient documentation

## 2015-09-17 DIAGNOSIS — M25512 Pain in left shoulder: Secondary | ICD-10-CM | POA: Diagnosis present
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
## 2015-09-19 ENCOUNTER — Ambulatory Visit: Payer: Medicare Other | Admitting: Physical Therapy

## 2015-09-19 DIAGNOSIS — M25612 Stiffness of left shoulder, not elsewhere classified: Secondary | ICD-10-CM | POA: Diagnosis not present

## 2015-09-19 DIAGNOSIS — M6281 Muscle weakness (generalized): Secondary | ICD-10-CM
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
## 2015-09-23 ENCOUNTER — Other Ambulatory Visit: Payer: Self-pay | Admitting: General Surgery

## 2015-09-23 DIAGNOSIS — C50912 Malignant neoplasm of unspecified site of left female breast: Secondary | ICD-10-CM

## 2015-09-24 ENCOUNTER — Ambulatory Visit: Payer: Medicare Other | Admitting: Physical Therapy

## 2015-09-24 DIAGNOSIS — M25612 Stiffness of left shoulder, not elsewhere classified: Secondary | ICD-10-CM

## 2015-09-24 DIAGNOSIS — M6281 Muscle weakness (generalized): Secondary | ICD-10-CM
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
## 2015-09-24 NOTE — Patient Instructions (Signed)
  KEEP SHOULDER DOWN.     Strengthening: Resisted Flexion   Hold tubing with left arm at side. Pull forward and up. Move shoulder through pain-free range of motion. Repeat _10___ times per set.    Strengthening: Resisted Internal Rotation  KEEP ELBOW AT WAIST    Hold tubing in left hand, elbow at side and forearm out. Rotate forearm in across body. Repeat _10___ times per set.   http://orth.exer.us/830    Strengthening: Resisted Extension   Hold tubing in right hand, arm forward. Pull arm back, elbow straight. Repeat 10___ times per set.       Copyright  VHI. All rights reserved.  Strengthening: Resisted External Rotation  KEEP ELBOW AT WAIST, THIS PICTURE IS NOT RIGHT.    Hold tubing in right hand, elbow at side and forearm across body. Rotate forearm out. Repeat _10___ times per set.   Copyright  VHI. All rights reserved.  Strengthening: Resisted Extension   Hold tubing in right hand, arm forward. Pull arm back, elbow straight. Repeat ____ times per set. Do ____ sets per session. Do ____ sessions per day.  http://orth.exer.us/832   Copyright  VHI. All rights reserved.

## 2015-09-26 ENCOUNTER — Ambulatory Visit: Payer: Medicare Other | Admitting: Physical Therapy

## 2015-09-26 DIAGNOSIS — M25612 Stiffness of left shoulder, not elsewhere classified: Secondary | ICD-10-CM

## 2015-09-26 DIAGNOSIS — M6281 Muscle weakness (generalized): Secondary | ICD-10-CM
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
## 2015-10-01 ENCOUNTER — Ambulatory Visit: Payer: Medicare Other | Admitting: Physical Therapy

## 2015-10-01 DIAGNOSIS — M25612 Stiffness of left shoulder, not elsewhere classified: Secondary | ICD-10-CM

## 2015-10-01 DIAGNOSIS — M6281 Muscle weakness (generalized): Secondary | ICD-10-CM
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
## 2015-10-03 ENCOUNTER — Ambulatory Visit: Payer: Medicare Other | Admitting: Physical Therapy

## 2015-10-03 DIAGNOSIS — M25612 Stiffness of left shoulder, not elsewhere classified: Secondary | ICD-10-CM | POA: Diagnosis not present

## 2015-10-03 DIAGNOSIS — M6281 Muscle weakness (generalized): Secondary | ICD-10-CM

## 2015-10-03 DIAGNOSIS — M25512 Pain in left shoulder: Secondary | ICD-10-CM
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
## 2015-10-07 ENCOUNTER — Ambulatory Visit: Payer: Medicare Other | Admitting: Physical Therapy

## 2015-10-07 DIAGNOSIS — M25512 Pain in left shoulder: Secondary | ICD-10-CM

## 2015-10-07 DIAGNOSIS — M25612 Stiffness of left shoulder, not elsewhere classified: Secondary | ICD-10-CM | POA: Diagnosis not present

## 2015-10-07 DIAGNOSIS — M6281 Muscle weakness (generalized): Secondary | ICD-10-CM
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
## 2015-10-08 ENCOUNTER — Encounter: Payer: Medicare Other | Admitting: Physical Therapy

## 2015-10-09 ENCOUNTER — Ambulatory Visit: Payer: Medicare Other | Admitting: Physical Therapy

## 2015-10-09 DIAGNOSIS — M25612 Stiffness of left shoulder, not elsewhere classified: Secondary | ICD-10-CM | POA: Diagnosis not present

## 2015-10-09 DIAGNOSIS — M25512 Pain in left shoulder: Secondary | ICD-10-CM

## 2015-10-09 DIAGNOSIS — M6281 Muscle weakness (generalized): Secondary | ICD-10-CM
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
## 2015-10-09 NOTE — Patient Instructions (Signed)
Levator Stretch   Grasp seat or sit on hand on side to be stretched. Turn head toward other side and look down. Use hand on head to gently stretch neck in that position. Hold ____ seconds. Repeat on other side. Repeat ____ times. Do ____ sessions per day.  http://gt2.exer.us/30   Copyright  VHI. All rights reserved.  Side-Bending   One hand on opposite side of head, pull head to side as far as is comfortable. Stop if there is pain. Hold ____ seconds. Repeat with other hand to other side. Repeat ____ times. Do ____ sessions per day.   Copyright  VHI. All rights reserved.  Scapular Retraction (Standing)   With arms at sides, pinch shoulder blades together. Repeat ____ times per set. Do ____ sets per session. Do ____ sessions per day.  http://orth.exer.us/944   Copyright  VHI. All rights reserved.  Chin Protraction / Retraction   Slide head forward keeping chin level. Slide head back, pulling chin in. Hold each position ___ seconds. Repeat ___ times. Do ___ sessions per day.  Copyright  VHI. All rights reserved.

## 2015-10-14 ENCOUNTER — Ambulatory Visit: Payer: Medicare Other | Attending: General Surgery | Admitting: Physical Therapy

## 2015-10-14 DIAGNOSIS — M25612 Stiffness of left shoulder, not elsewhere classified: Secondary | ICD-10-CM | POA: Insufficient documentation

## 2015-10-14 DIAGNOSIS — M25512 Pain in left shoulder: Secondary | ICD-10-CM | POA: Diagnosis present

## 2015-10-14 DIAGNOSIS — M6281 Muscle weakness (generalized): Secondary | ICD-10-CM | POA: Diagnosis present

## 2015-10-14 HISTORY — PX: REDUCTION MAMMAPLASTY: SUR839
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
## 2015-10-16 ENCOUNTER — Ambulatory Visit: Payer: Medicare Other | Admitting: Physical Therapy

## 2015-10-16 DIAGNOSIS — M6281 Muscle weakness (generalized): Secondary | ICD-10-CM

## 2015-10-16 DIAGNOSIS — M25612 Stiffness of left shoulder, not elsewhere classified: Secondary | ICD-10-CM | POA: Diagnosis not present

## 2015-10-16 DIAGNOSIS — M25512 Pain in left shoulder: Secondary | ICD-10-CM
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
## 2015-10-17 ENCOUNTER — Ambulatory Visit: Payer: Medicare Other | Admitting: Physical Therapy

## 2015-10-21 ENCOUNTER — Ambulatory Visit: Payer: Medicare Other | Admitting: Physical Therapy

## 2015-10-21 DIAGNOSIS — M6281 Muscle weakness (generalized): Secondary | ICD-10-CM

## 2015-10-21 DIAGNOSIS — M25612 Stiffness of left shoulder, not elsewhere classified: Secondary | ICD-10-CM | POA: Diagnosis not present
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
## 2015-10-23 ENCOUNTER — Ambulatory Visit: Payer: Medicare Other | Admitting: Physical Therapy

## 2015-10-23 DIAGNOSIS — M25612 Stiffness of left shoulder, not elsewhere classified: Secondary | ICD-10-CM | POA: Diagnosis not present

## 2015-10-23 DIAGNOSIS — M6281 Muscle weakness (generalized): Secondary | ICD-10-CM

## 2015-10-23 DIAGNOSIS — M25512 Pain in left shoulder: Secondary | ICD-10-CM
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
## 2015-10-24 ENCOUNTER — Other Ambulatory Visit: Payer: Self-pay | Admitting: General Surgery

## 2015-10-24 DIAGNOSIS — C50912 Malignant neoplasm of unspecified site of left female breast: Secondary | ICD-10-CM

## 2015-11-04 ENCOUNTER — Ambulatory Visit: Payer: Medicare Other | Admitting: Physical Therapy

## 2015-11-04 DIAGNOSIS — M25512 Pain in left shoulder: Secondary | ICD-10-CM

## 2015-11-04 DIAGNOSIS — M25612 Stiffness of left shoulder, not elsewhere classified: Secondary | ICD-10-CM

## 2015-11-04 DIAGNOSIS — M6281 Muscle weakness (generalized): Secondary | ICD-10-CM
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
## 2015-11-06 ENCOUNTER — Ambulatory Visit: Payer: Medicare Other | Admitting: Physical Therapy

## 2015-11-06 ENCOUNTER — Encounter (HOSPITAL_BASED_OUTPATIENT_CLINIC_OR_DEPARTMENT_OTHER): Payer: Self-pay | Admitting: *Deleted

## 2015-11-06 DIAGNOSIS — M25612 Stiffness of left shoulder, not elsewhere classified: Secondary | ICD-10-CM

## 2015-11-06 DIAGNOSIS — M25512 Pain in left shoulder: Secondary | ICD-10-CM

## 2015-11-06 DIAGNOSIS — M6281 Muscle weakness (generalized): Secondary | ICD-10-CM

## 2015-11-06 NOTE — Therapy (Signed)
[place], [place], [address] Phone: [phone]   Fax:  [phone]  Physical Therapy Treatment  Patient Details  Name: Kelly Hancock MRN: [id number] Date of Birth: 25-May-1945 Referring Provider: Dr. Gomez Cleverly   Encounter Date: 11/06/2015      PT End of Session - 11/06/15 1255    Visit Number 17   Number of Visits 20   Date for PT Re-Evaluation 11/12/15   PT Start Time 3382   PT Stop Time 1100   PT Time Calculation (min) 45 min   Activity Tolerance Patient tolerated treatment well   Behavior During Therapy [facility] for tasks assessed/performed      Past Medical History:  Diagnosis Date  . Anxiety disorder   . Arthritis   . Gastroesophageal reflux disease   . High blood pressure   . History of transfusion   . Lump in female breast     Past Surgical History:  Procedure Laterality Date  . APPENDECTOMY    . cyst removed from left foot     at age 32  . Right Hip surgery  late 1990's    There were no vitals filed for this visit.                       [place] Adult PT Treatment/Exercise - 11/06/15 0001      Self-Care   Other Self-Care Comments  reviewed options for post op compression bra.  Showed pt sample of [employer] brea and showed her [employer]. She will go to [employer] to find the one she wants for post op. Recommeded one with high sides fir close to anterior and posterior axilla to prevent any fluid accumulation around axilla.  Reviewed post op shoulder exercises.  Pt signed up for ABC class                 PT Education - 11/06/15 1254    Education provided Yes   Education Details what to look for in a compression bra    Person(s) Educated Patient   Methods Explanation;Handout                [facility] Goals - 11/06/15 1259      CC Long Term Goal  #1   Title Patient will improve left shoulder abduction  range of motion to 130   degrees so that she will more easily be able to have axillary  node disseciton in surgery    Baseline 70, 120 on 09/24/2015, 145 degrees on 7/21 2017 with some scaption    Status Achieved     CC Long Term Goal  #2   Title Patient will decrease the DASH score to < 10   to demonstrate increased functional use of upper extremity   Baseline 22.73 on 09/24/2015, 13.64 on 10/03/2015  On 11/06/2015, forgot to do Quick DASH, but pt reports she is feeling much better with pain from stress    Status Partially Met     CC Long Term Goal  #3   Title Patient will be independent in a  home exercise program to increase general activity tolerance to prepare for surgery    Status Achieved     CC Long Term Goal  #4   Title Pt reports that her shoulder pain has decreased by 75% since the start of treatment.    Status Achieved     CC Long Term Goal  #5  Title pt will have left shoulder flexion of > 150 degrees to allow her to reach things from a high kitchen cabinet with greater ease    Baseline 140   Status Partially Met            Plan - 11-19-2015 1256    Clinical Impression Statement Pt continues to have anxiety about her upcoming surgery.  TIme spent on reviewing her post op self care and exercises to continue. She is pleased with the porgress she has made with her shoulder and wants to continue exercise and the dietary changes she has made so far   Rehab Potential Good   Clinical Impairments Affecting Rehab Potential  previous left shoulder injury with instability.  Pt reports she has been told she "needs a totatl shoulder"    PT Next Visit Plan Discharge this episode    Consulted and Agree with Plan of Care Patient      Patient will benefit from skilled therapeutic intervention in order to improve the following deficits and impairments:  Decreased range of motion, Decreased strength, Decreased activity tolerance, Postural dysfunction, Pain, Impaired UE functional use, Increased fascial  restricitons  Visit Diagnosis: Stiffness of left shoulder, not elsewhere classified  Muscle weakness (generalized)  Pain in left shoulder       G-Codes - 19-Nov-2015 1302    Functional Assessment Tool Used Quick DASH    Functional Limitation Carrying, moving and handling objects   Carrying, Moving and Handling Objects Goal Status ([id number]) At least 1 percent but less than 20 percent impaired, limited or restricted   Carrying, Moving and Handling Objects Discharge Status [phone]) At least 1 percent but less than 20 percent impaired, limited or restricted      Problem List Patient Active Problem List   Diagnosis Date Noted  . Carcinoma of upper-outer quadrant of left female breast ([place]) 09/05/2015  . Post-traumatic osteoarthritis of right knee 08/29/2014  . Shoulder dislocation, recurrent 07/29/2010  . DEGENERATIVE JOINT DISEASE, LEFT SHOULDER 03/19/2010  . DEGENERATIVE JOINT DISEASE, ANKLE 11/19/2009  . ANXIETY 05/12/2006  . DEPRESSIVE DISORDER, NOS 05/12/2006  . DIVERTICULITIS OF COLON, NOS 05/12/2006  . MENOPAUSAL SYNDROME 05/12/2006     PHYSICAL THERAPY DISCHARGE SUMMARY  Visits from Start of Care: 17  Current functional level related to goals / functional outcomes: Improved shoulder range of motion and decreased pain   Remaining deficits: Pain in upper back and shoulder    Education / Equipment: Home exercise,  Plan: Patient agrees to discharge.  Patient goals were partially met. Patient is being discharged due to being pleased with the current functional level.  ?????     Donato Heinz. Owens Shark PT  [name] Nov 19, 2015, 1:03 PM  [place], [place], [address] Phone: [phone]   Fax:  [phone]  Name: Kelly Hancock MRN: [id number] Date of Birth: 1945-12-29

## 2015-11-11 ENCOUNTER — Ambulatory Visit
Admission: RE | Admit: 2015-11-11 | Discharge: 2015-11-11 | Disposition: A | Discharge status: Home / Self Care | Payer: Medicare Other | Source: Ambulatory Visit | Attending: General Surgery | Admitting: General Surgery

## 2015-11-11 DIAGNOSIS — C50912 Malignant neoplasm of unspecified site of left female breast: Secondary | ICD-10-CM

## 2015-11-11 NOTE — H&P (Signed)
History of Present Illness Kelly Hancock [name]; 10/31/2015 2:21 PM) The patient is a 70 year old female who presents with breast cancer. She returns for preoperative visit prior to planned left breast lumpectomy and axillary sentinel lymph node biopsy. She has seen Dr. Marla Roe and we planned a staged bilateral reduction mammoplasty. She has been working on physical therapy to get left shoulder mobility back as well as exercising and weight loss and is extremely pleased at her overall progress with her shoulder mobility and weight loss and overall health.  Her original presentation was as follows: She is a post menopausal female referred by Dr. Noel Christmas for evaluation of recently diagnosed carcinoma of the left breast. She recently presented for a screening mamogram revealing possible asymmetry. She had not had a mammogram in about 3 years. Subsequent imaging included diagnostic mamogram showing a 2.6 cm irregular spiculated mass in the upper outer quadrant of the left breast and ultrasound showing a 2 cm hypoechoic irregular mass at the 2 o'clock position 7 cm from the nipple measuring 2 cm in greatest diameter. Axillary ultrasound was negative. An ultrasound guided breast biopsy was performed on August 19, 2005 with pathology revealing invasive ductal carcinoma of the breast. She is seen now in the office for initial treatment planning. She has experienced no breast symptoms such as lump or skin change or nipple discharge. She does not have a personal history of any previous breast problems.  Findings at that time were the following: Tumor size: 2.6 cm Tumor grade: 2, Ki-67 12% Estrogen Receptor: 100% positive Progesterone Receptor: 50% positive Her-2 neu: Negative Lymph node status: Negative    Allergies Elbert Ewings, CMA; 10/31/2015 1:44 PM) Demerol *ANALGESICS - OPIOID*  Medication History Elbert Ewings, CMA; 10/31/2015 1:44 PM) HydroCHLOROthiazide (25MG Tablet, Oral)  Active. Super C Complex (1000MG Tablet, Oral) Active. B Complex 100 TR (Oral) Active. Omega 3 (1000MG Capsule, Oral) Active. Vitamin D (50000U Tablet, Oral three times a week) Active. Calcium Citrate (500MG Capsule, Oral) Active. ALPRAZolam (0.5MG Tablet, Oral) Active. Medications Reconciled  Vitals Elbert Ewings CMA; 10/31/2015 1:44 PM) 10/31/2015 1:44 PM Weight: 150 lb Height: 66in Body Surface Area: 1.77 m Body Mass Index: 24.21 kg/m  Temp.: 97.2F(Temporal)  Pulse: 90 (Regular)  BP: 128/70 (Sitting, Left Arm, Standard)       Physical Exam Kelly Hancock T. Stran Raper Hancock; 10/31/2015 2:22 PM) The physical exam findings are as follows: Note:General: Alert, well-developed and well nourished Caucasian female, in no distress Skin: Warm and dry without rash or infection. HEENT: No palpable masses or thyromegaly. Sclera nonicteric. Pupils equal round and reactive. Lymph nodes: No cervical, supraclavicular, or inguinal nodes palpable. Breasts: There is a freely movable proximally 2.5 cm mass palpable in the upper outer left breast. Lungs: Breath sounds clear and equal. No wheezing or increased work of breathing. Cardiovascular: Regular rate and rhythm without murmer. No JVD or edema. Extremities: No edema or joint swelling or deformity. No chronic venous stasis changes. Neurologic: Alert and fully oriented. Gait normal. No focal weakness. Psychiatric: Normal mood and affect. Thought content appropriate with normal judgement and insight  General Mental Status-Alert. General Appearance-Consistent with stated age. Hydration-Well hydrated. Voice-Normal.  Head and Neck Head-normocephalic, atraumatic with no lesions or palpable masses. Trachea-midline. Thyroid Gland Characteristics - normal size and consistency.  Eye Eyeball - Bilateral-Extraocular movements intact. Sclera/Conjunctiva - Bilateral-No scleral icterus.  Chest and Lung Exam Chest and  lung exam reveals -quiet, even and easy respiratory effort with no use of accessory muscles and  on auscultation, normal breath sounds, no adventitious sounds and normal vocal resonance. Inspection Chest Wall - Normal. Back - normal.  Breast Breast - Left-Symmetric, Non Tender, No Biopsy scars, no Dimpling, No Inflammation, No Lumpectomy scars, No Mastectomy scars, No Peau d' Orange. Breast - Right-Symmetric, Non Tender, No Biopsy scars, no Dimpling, No Inflammation, No Lumpectomy scars, No Mastectomy scars, No Peau d' Orange. Breast Lump-No Palpable Breast Mass.  Cardiovascular Cardiovascular examination reveals -normal heart sounds, regular rate and rhythm with no murmurs and normal pedal pulses bilaterally.  Abdomen Inspection Inspection of the abdomen reveals - No Hernias. Skin - Scar - no surgical scars. Palpation/Percussion Palpation and Percussion of the abdomen reveal - Soft, Non Tender, No Rebound tenderness, No Rigidity (guarding) and No hepatosplenomegaly. Auscultation Auscultation of the abdomen reveals - Bowel sounds normal.  Neurologic Neurologic evaluation reveals -alert and oriented x 3 with no impairment of recent or remote memory. Mental Status-Normal.  Musculoskeletal Normal Exam - Left-Upper Extremity Strength Normal and Lower Extremity Strength Normal. Normal Exam - Right-Upper Extremity Strength Normal and Lower Extremity Strength Normal.  Lymphatic Head & Neck  General Head & Neck Lymphatics: Bilateral - Description - Normal. Axillary  General Axillary Region: Bilateral - Description - Normal. Tenderness - Non Tender. Femoral & Inguinal  Generalized Femoral & Inguinal Lymphatics: Bilateral - Description - Normal. Tenderness - Non Tender.    Assessment & Plan Kelly Hancock T. Nate Common Hancock; 10/31/2015 2:24 PM) STAGE II BREAST CANCER, LEFT (J85.631) Impression: 70 year old female with a new diagnosis of cancer of the left breast, upper outer  quadrant. Clinical stage 2A, ER positive, PR positive, HER-2 negative. We are scheduled to proceed with radioactive seed localized left breast lumpectomy and axillary sentinel lymph node biopsy. She will be marked by Dr. Marla Roe prior to surgery. Bilateral breast reduction will be done as a staged procedure. We reviewed the surgery in detail again today and all of her husband's and her questions were answered.

## 2015-11-11 NOTE — Progress Notes (Signed)
Pt given 8 oz carton of boost breeze with verbal and written instructions to drink at 0930 morning of surgery,and no other liquids after midnight.  Teach back and pt voiced understanding

## 2015-11-12 ENCOUNTER — Encounter (HOSPITAL_BASED_OUTPATIENT_CLINIC_OR_DEPARTMENT_OTHER): Payer: Self-pay | Admitting: *Deleted

## 2015-11-12 ENCOUNTER — Ambulatory Visit (HOSPITAL_BASED_OUTPATIENT_CLINIC_OR_DEPARTMENT_OTHER): Payer: Medicare Other | Admitting: Anesthesiology

## 2015-11-12 ENCOUNTER — Encounter (HOSPITAL_BASED_OUTPATIENT_CLINIC_OR_DEPARTMENT_OTHER)
Admission: RE | Disposition: A | Discharge status: Home / Self Care | Payer: Self-pay | Source: Ambulatory Visit | Attending: General Surgery

## 2015-11-12 ENCOUNTER — Ambulatory Visit
Admission: RE | Admit: 2015-11-12 | Discharge: 2015-11-12 | Disposition: A | Discharge status: Home / Self Care | Payer: Medicare Other | Source: Ambulatory Visit | Attending: General Surgery | Admitting: General Surgery

## 2015-11-12 ENCOUNTER — Ambulatory Visit (HOSPITAL_BASED_OUTPATIENT_CLINIC_OR_DEPARTMENT_OTHER)
Admission: RE | Admit: 2015-11-12 | Discharge: 2015-11-12 | Disposition: A | Discharge status: Home / Self Care | Payer: Medicare Other | Source: Ambulatory Visit | Attending: General Surgery | Admitting: General Surgery

## 2015-11-12 ENCOUNTER — Ambulatory Visit (HOSPITAL_COMMUNITY)
Admission: RE | Admit: 2015-11-12 | Discharge: 2015-11-12 | Disposition: A | Discharge status: Home / Self Care | Payer: Medicare Other | Source: Ambulatory Visit | Attending: General Surgery | Admitting: General Surgery

## 2015-11-12 DIAGNOSIS — Z170 Estrogen receptor positive status [ER+]: Secondary | ICD-10-CM | POA: Diagnosis not present

## 2015-11-12 DIAGNOSIS — F419 Anxiety disorder, unspecified: Secondary | ICD-10-CM | POA: Diagnosis not present

## 2015-11-12 DIAGNOSIS — C50912 Malignant neoplasm of unspecified site of left female breast: Secondary | ICD-10-CM

## 2015-11-12 DIAGNOSIS — M1990 Unspecified osteoarthritis, unspecified site: Secondary | ICD-10-CM | POA: Diagnosis not present

## 2015-11-12 DIAGNOSIS — C50412 Malignant neoplasm of upper-outer quadrant of left female breast: Secondary | ICD-10-CM | POA: Insufficient documentation

## 2015-11-12 HISTORY — PX: BREAST LUMPECTOMY: SHX2

## 2015-11-12 HISTORY — PX: BREAST LUMPECTOMY WITH RADIOACTIVE SEED AND SENTINEL LYMPH NODE BIOPSY: SHX6550

## 2015-11-12 SURGERY — BREAST LUMPECTOMY WITH RADIOACTIVE SEED AND SENTINEL LYMPH NODE BIOPSY
Anesthesia: Regional | Site: Breast | Laterality: Left

## 2015-11-12 MED ORDER — FENTANYL CITRATE (PF) 100 MCG/2ML IJ SOLN
INTRAMUSCULAR | Status: AC
  Filled 2015-11-12: qty 2

## 2015-11-12 MED ORDER — HYDROCODONE-ACETAMINOPHEN 7.5-325 MG PO TABS: 1.0000 | ORAL_TABLET | Freq: Once | ORAL | Status: DC | PRN

## 2015-11-12 MED ORDER — GABAPENTIN 300 MG PO CAPS
300.0000 mg | ORAL_CAPSULE | ORAL | Status: AC
  Administered 2015-11-12: 300 mg via ORAL

## 2015-11-12 MED ORDER — CELECOXIB 400 MG PO CAPS
400.0000 mg | ORAL_CAPSULE | ORAL | Status: AC
  Administered 2015-11-12: 400 mg via ORAL

## 2015-11-12 MED ORDER — DEXAMETHASONE SODIUM PHOSPHATE 10 MG/ML IJ SOLN
INTRAMUSCULAR | Status: AC
  Filled 2015-11-12: qty 1

## 2015-11-12 MED ORDER — PROPOFOL 10 MG/ML IV BOLUS
INTRAVENOUS | Status: DC | PRN
  Administered 2015-11-12: 200 mg via INTRAVENOUS

## 2015-11-12 MED ORDER — FENTANYL CITRATE (PF) 100 MCG/2ML IJ SOLN
25.0000 ug | INTRAMUSCULAR | Status: DC | PRN
  Administered 2015-11-12 (×2): 50 ug via INTRAVENOUS

## 2015-11-12 MED ORDER — LIDOCAINE 2% (20 MG/ML) 5 ML SYRINGE
INTRAMUSCULAR | Status: AC
  Filled 2015-11-12: qty 5

## 2015-11-12 MED ORDER — CHLORHEXIDINE GLUCONATE CLOTH 2 % EX PADS
6.0000 | MEDICATED_PAD | Freq: Once | CUTANEOUS | Status: DC
Start: 2015-11-12 — End: 2015-11-12

## 2015-11-12 MED ORDER — CHLORHEXIDINE GLUCONATE CLOTH 2 % EX PADS: 6.0000 | MEDICATED_PAD | Freq: Once | CUTANEOUS | Status: DC

## 2015-11-12 MED ORDER — BUPIVACAINE-EPINEPHRINE (PF) 0.5% -1:200000 IJ SOLN
INTRAMUSCULAR | Status: DC | PRN
  Administered 2015-11-12: 25 mL

## 2015-11-12 MED ORDER — GLYCOPYRROLATE 0.2 MG/ML IJ SOLN
0.2000 mg | Freq: Once | INTRAMUSCULAR | Status: DC | PRN
Start: 2015-11-12 — End: 2015-11-12

## 2015-11-12 MED ORDER — MIDAZOLAM HCL 2 MG/2ML IJ SOLN
1.0000 mg | INTRAMUSCULAR | Status: DC | PRN
  Administered 2015-11-12: 1 mg via INTRAVENOUS

## 2015-11-12 MED ORDER — EPHEDRINE SULFATE 50 MG/ML IJ SOLN
INTRAMUSCULAR | Status: DC | PRN
  Administered 2015-11-12 (×3): 5 mg via INTRAVENOUS

## 2015-11-12 MED ORDER — CEFAZOLIN SODIUM-DEXTROSE 2-4 GM/100ML-% IV SOLN
INTRAVENOUS | Status: AC
  Filled 2015-11-12: qty 100

## 2015-11-12 MED ORDER — LACTATED RINGERS IV SOLN
INTRAVENOUS | Status: DC
  Administered 2015-11-12 (×2): via INTRAVENOUS

## 2015-11-12 MED ORDER — HYDROCODONE-ACETAMINOPHEN 5-325 MG PO TABS: 1.0000 | ORAL_TABLET | ORAL | 0 refills | Status: DC | PRN

## 2015-11-12 MED ORDER — GABAPENTIN 300 MG PO CAPS
ORAL_CAPSULE | ORAL | Status: AC
  Filled 2015-11-12: qty 1

## 2015-11-12 MED ORDER — ACETAMINOPHEN 500 MG PO TABS
1000.0000 mg | ORAL_TABLET | ORAL | Status: AC
  Administered 2015-11-12: 1000 mg via ORAL

## 2015-11-12 MED ORDER — TECHNETIUM TC 99M SULFUR COLLOID FILTERED
1.0000 | Freq: Once | INTRAVENOUS | Status: AC | PRN
Start: 2015-11-12 — End: 2015-11-12
  Administered 2015-11-12: 1 via INTRADERMAL

## 2015-11-12 MED ORDER — MIDAZOLAM HCL 2 MG/2ML IJ SOLN
INTRAMUSCULAR | Status: AC
Start: 2015-11-12 — End: 2015-11-12
  Filled 2015-11-12: qty 2

## 2015-11-12 MED ORDER — BUPIVACAINE-EPINEPHRINE (PF) 0.5% -1:200000 IJ SOLN
INTRAMUSCULAR | Status: DC | PRN
  Administered 2015-11-12: 20 mL via PERINEURAL

## 2015-11-12 MED ORDER — FENTANYL CITRATE (PF) 100 MCG/2ML IJ SOLN
50.0000 ug | INTRAMUSCULAR | Status: DC | PRN
  Administered 2015-11-12: 50 ug via INTRAVENOUS

## 2015-11-12 MED ORDER — PROMETHAZINE HCL 25 MG/ML IJ SOLN: 6.2500 mg | INTRAMUSCULAR | Status: DC | PRN

## 2015-11-12 MED ORDER — ONDANSETRON HCL 4 MG/2ML IJ SOLN
INTRAMUSCULAR | Status: DC | PRN
  Administered 2015-11-12: 4 mg via INTRAVENOUS

## 2015-11-12 MED ORDER — ACETAMINOPHEN 500 MG PO TABS
ORAL_TABLET | ORAL | Status: AC
  Filled 2015-11-12: qty 2

## 2015-11-12 MED ORDER — ONDANSETRON HCL 4 MG/2ML IJ SOLN
INTRAMUSCULAR | Status: AC
  Filled 2015-11-12: qty 2

## 2015-11-12 MED ORDER — PROPOFOL 10 MG/ML IV BOLUS
INTRAVENOUS | Status: AC
  Filled 2015-11-12: qty 20

## 2015-11-12 MED ORDER — CELECOXIB 200 MG PO CAPS
ORAL_CAPSULE | ORAL | Status: AC
  Filled 2015-11-12: qty 2

## 2015-11-12 MED ORDER — CEFAZOLIN SODIUM-DEXTROSE 2-4 GM/100ML-% IV SOLN
2.0000 g | INTRAVENOUS | Status: AC
  Administered 2015-11-12: 2 g via INTRAVENOUS

## 2015-11-12 MED ORDER — DEXAMETHASONE SODIUM PHOSPHATE 4 MG/ML IJ SOLN
INTRAMUSCULAR | Status: DC | PRN
  Administered 2015-11-12: 10 mg via INTRAVENOUS

## 2015-11-12 MED ORDER — SCOPOLAMINE 1 MG/3DAYS TD PT72: 1.0000 | MEDICATED_PATCH | Freq: Once | TRANSDERMAL | Status: DC | PRN

## 2015-11-12 MED ORDER — LIDOCAINE 2% (20 MG/ML) 5 ML SYRINGE
INTRAMUSCULAR | Status: DC | PRN
  Administered 2015-11-12: 100 mg via INTRAVENOUS

## 2015-11-12 SURGICAL SUPPLY — 47 items
BENZOIN TINCTURE PRP APPL 2/3 (GAUZE/BANDAGES/DRESSINGS) ×2 IMPLANT
BINDER BREAST LRG (GAUZE/BANDAGES/DRESSINGS) IMPLANT
BINDER BREAST MEDIUM (GAUZE/BANDAGES/DRESSINGS) IMPLANT
BINDER BREAST XLRG (GAUZE/BANDAGES/DRESSINGS) ×2 IMPLANT
BINDER BREAST XXLRG (GAUZE/BANDAGES/DRESSINGS) IMPLANT
BLADE SURG 15 STRL LF DISP TIS (BLADE) ×1 IMPLANT
BLADE SURG 15 STRL SS (BLADE) ×1
CANISTER SUC SOCK COL 7IN (MISCELLANEOUS) IMPLANT
CANISTER SUCT 1200ML W/VALVE (MISCELLANEOUS) IMPLANT
CHLORAPREP W/TINT 26ML (MISCELLANEOUS) ×2 IMPLANT
CLIP TI WIDE RED SMALL 6 (CLIP) ×2 IMPLANT
COVER BACK TABLE 60X90IN (DRAPES) ×2 IMPLANT
COVER MAYO STAND STRL (DRAPES) ×2 IMPLANT
COVER PROBE W GEL 5X96 (DRAPES) ×2 IMPLANT
DECANTER SPIKE VIAL GLASS SM (MISCELLANEOUS) ×2 IMPLANT
DEVICE DUBIN W/COMP PLATE 8390 (MISCELLANEOUS) ×2 IMPLANT
DRAPE LAPAROSCOPIC ABDOMINAL (DRAPES) ×2 IMPLANT
DRAPE UTILITY XL STRL (DRAPES) ×2 IMPLANT
ELECT COATED BLADE 2.86 ST (ELECTRODE) ×2 IMPLANT
ELECT REM PT RETURN 9FT ADLT (ELECTROSURGICAL) ×2
ELECTRODE REM PT RTRN 9FT ADLT (ELECTROSURGICAL) ×1 IMPLANT
GLOVE BIOGEL PI IND STRL 8 (GLOVE) ×1 IMPLANT
GLOVE BIOGEL PI INDICATOR 8 (GLOVE) ×1
GLOVE ECLIPSE 7.5 STRL STRAW (GLOVE) ×2 IMPLANT
GOWN STRL REUS W/ TWL LRG LVL3 (GOWN DISPOSABLE) ×1 IMPLANT
GOWN STRL REUS W/ TWL XL LVL3 (GOWN DISPOSABLE) ×1 IMPLANT
GOWN STRL REUS W/TWL LRG LVL3 (GOWN DISPOSABLE) ×1
GOWN STRL REUS W/TWL XL LVL3 (GOWN DISPOSABLE) ×1
ILLUMINATOR WAVEGUIDE N/F (MISCELLANEOUS) IMPLANT
KIT MARKER MARGIN INK (KITS) ×2 IMPLANT
LIQUID BAND (GAUZE/BANDAGES/DRESSINGS) ×2 IMPLANT
NDL SAFETY ECLIPSE 18X1.5 (NEEDLE) ×1 IMPLANT
NEEDLE HYPO 18GX1.5 SHARP (NEEDLE) ×1
NEEDLE HYPO 25X1 1.5 SAFETY (NEEDLE) ×4 IMPLANT
NS IRRIG 1000ML POUR BTL (IV SOLUTION) IMPLANT
PACK BASIN DAY SURGERY FS (CUSTOM PROCEDURE TRAY) ×2 IMPLANT
PENCIL BUTTON HOLSTER BLD 10FT (ELECTRODE) ×2 IMPLANT
SLEEVE SCD COMPRESS KNEE MED (MISCELLANEOUS) ×2 IMPLANT
SPONGE LAP 4X18 X RAY DECT (DISPOSABLE) ×2 IMPLANT
STRIP CLOSURE SKIN 1/2X4 (GAUZE/BANDAGES/DRESSINGS) ×2 IMPLANT
SUT MON AB 5-0 PS2 18 (SUTURE) ×2 IMPLANT
SUT VICRYL 3-0 CR8 SH (SUTURE) ×4 IMPLANT
SYR CONTROL 10ML LL (SYRINGE) ×4 IMPLANT
TOWEL OR 17X24 6PK STRL BLUE (TOWEL DISPOSABLE) ×2 IMPLANT
TOWEL OR NON WOVEN STRL DISP B (DISPOSABLE) ×2 IMPLANT
TUBE CONNECTING 20X1/4 (TUBING) ×2 IMPLANT
YANKAUER SUCT BULB TIP NO VENT (SUCTIONS) ×2 IMPLANT

## 2015-11-12 NOTE — Anesthesia Postprocedure Evaluation (Signed)
Anesthesia Post Note  Patient: Kelly Hancock  Procedure(s) Performed: Procedure(s) (LRB): BREAST LUMPECTOMY WITH RADIOACTIVE SEED AND SENTINEL LYMPH NODE BIOPSY (Left)  Patient location during evaluation: PACU Anesthesia Type: General and Regional Level of consciousness: awake and alert Pain management: pain level controlled Vital Signs Assessment: post-procedure vital signs reviewed and stable Respiratory status: spontaneous breathing, nonlabored ventilation, respiratory function stable and patient connected to nasal cannula oxygen Cardiovascular status: blood pressure returned to baseline and stable Postop Assessment: no signs of nausea or vomiting Anesthetic complications: no    Last Vitals:  Vitals:   11/12/15 1405 11/12/15 1445  BP:  (!) 161/76  Pulse: 67 (!) 51  Resp: 15 18  Temp:  36.7 C    Last Pain:  Vitals:   11/12/15 1445  TempSrc:   PainSc: 3                  Tiajuana Amass

## 2015-11-12 NOTE — Discharge Instructions (Signed)
[facility] °Office Phone Number [phone] ° °BREAST BIOPSY/ PARTIAL MASTECTOMY: POST OP INSTRUCTIONS ° °Always review your discharge instruction sheet given to you by the facility where your surgery was performed. ° °IF YOU HAVE DISABILITY OR FAMILY LEAVE FORMS, YOU MUST BRING THEM TO THE OFFICE FOR PROCESSING.  DO NOT GIVE THEM TO YOUR DOCTOR. ° °1. A prescription for pain medication may be given to you upon discharge.  Take your pain medication as prescribed, if needed.  If narcotic pain medicine is not needed, then you may take acetaminophen (Tylenol) or ibuprofen (Advil) as needed. °2. Take your usually prescribed medications unless otherwise directed °3. If you need a refill on your pain medication, please contact your pharmacy.  They will contact our office to request authorization.  Prescriptions will not be filled after 5pm or on week-ends. °4. You should eat very light the first 24 hours after surgery, such as soup, crackers, pudding, etc.  Resume your normal diet the day after surgery. °5. Most patients will experience some swelling and bruising in the breast.  Ice packs and a good support bra will help.  Swelling and bruising can take several days to resolve.  °6. It is common to experience some constipation if taking pain medication after surgery.  Increasing fluid intake and taking a stool softener will usually help or prevent this problem from occurring.  A mild laxative (Milk of Magnesia or Miralax) should be taken according to package directions if there are no bowel movements after 48 hours. °7. Unless discharge instructions indicate otherwise, you may remove your bandages 24-48 hours after surgery, and you may shower at that time.  You may have steri-strips (small skin tapes) in place directly over the incision.  These strips should be left on the skin for 7-10 days.  If your surgeon used skin glue on the incision, you may shower in 24 hours.  The glue will flake off over the  next 2-3 weeks.  Any sutures or staples will be removed at the office during your follow-up visit. °8. ACTIVITIES:  You may resume regular daily activities (gradually increasing) beginning the next day.  Wearing a good support bra or sports bra minimizes pain and swelling.  You may have sexual intercourse when it is comfortable. °a. You may drive when you no longer are taking prescription pain medication, you can comfortably wear a seatbelt, and you can safely maneuver your car and apply brakes. °b. RETURN TO WORK:  ______________________________________________________________________________________ °9. You should see your doctor in the office for a follow-up appointment approximately two weeks after your surgery.  Your doctor’s nurse will typically make your follow-up appointment when she calls you with your pathology report.  Expect your pathology report 2-3 business days after your surgery.  You may call to check if you do not hear from us after three days. °10. OTHER INSTRUCTIONS: _______________________________________________________________________________________________ _____________________________________________________________________________________________________________________________________ °_____________________________________________________________________________________________________________________________________ °_____________________________________________________________________________________________________________________________________ ° °WHEN TO CALL YOUR DOCTOR: °1. Fever over 101.0 °2. Nausea and/or vomiting. °3. Extreme swelling or bruising. °4. Continued bleeding from incision. °5. Increased pain, redness, or drainage from the incision. ° °The clinic staff is available to answer your questions during regular business hours.  Please don’t hesitate to call and ask to speak to one of the nurses for clinical concerns.  If you have a medical emergency, go to the nearest  emergency room or call 911.  A surgeon from [facility] is always on call at the hospital. ° °For further questions, please visit [email or web address]  ° ° ° °  Post Anesthesia Home Care Instructions ° °Activity: °Get plenty of rest for the remainder of the day. A responsible adult should stay with you for 24 hours following the procedure.  °For the next 24 hours, DO NOT: °-Drive a car °-Operate machinery °-Drink alcoholic beverages °-Take any medication unless instructed by your physician °-Make any legal decisions or sign important papers. ° °Meals: °Start with liquid foods such as gelatin or soup. Progress to regular foods as tolerated. Avoid greasy, spicy, heavy foods. If nausea and/or vomiting occur, drink only clear liquids until the nausea and/or vomiting subsides. Call your physician if vomiting continues. ° °Special Instructions/Symptoms: °Your throat may feel dry or sore from the anesthesia or the breathing tube placed in your throat during surgery. If this causes discomfort, gargle with warm salt water. The discomfort should disappear within 24 hours. ° °If you had a scopolamine patch placed behind your ear for the management of post- operative nausea and/or vomiting: ° °1. The medication in the patch is effective for 72 hours, after which it should be removed.  Wrap patch in a tissue and discard in the trash. Wash hands thoroughly with soap and water. °2. You may remove the patch earlier than 72 hours if you experience unpleasant side effects which may include dry mouth, dizziness or visual disturbances. °3. Avoid touching the patch. Wash your hands with soap and water after contact with the patch. °  ° °

## 2015-11-12 NOTE — Op Note (Signed)
Preoperative Diagnosis: LEFT BREAST CANCER  Postoprative Diagnosis: LEFT BREAST CANCER  Procedure: Procedure(s): BREAST LUMPECTOMY WITH RADIOACTIVE SEED AND SENTINEL LYMPH NODE BIOPSY   Surgeon: Excell Seltzer T   Assistants: None  Anesthesia:  General LMA anesthesia  Indications: Patient is a 70 year old female who presents with a clinical stage II a invasive lobular carcinoma of the upper outer left breast with a 2.6 cm primary and clinically node negative. After extensive preoperative workup and discussion we have elected to proceed with left breast lumpectomy, R SL, and left axillary sentinel lymph node biopsy as initial surgical therapy. This will be in conjunction with bilateral breast reduction in a staged fashion with Dr. Marla Roe. The procedure and risks and recovery alternatives have been discussed extensively and detailed elsewhere.    Procedure Detail:  The patient had undergone accurate placement of the radioactive seed at the tumor site which was confirmed in the holding area. Dr. Marla Roe had marked her reduction incisions and location of the pedicles. She underwent injection of 1 mCi of technetium sulfur colloid intradermally around the left nipple preoperatively. She had undergone a pectoral block by anesthesia. She was taken to the operating room, placed in the supine position on the operating table, and laryngeal mask general anesthesia induced.  The left breast was widely sterilely prepped and draped. She received preoperative IV antibiotics. PAS were in place. Patient timeout was performed and correct procedure verified. The neoprobe was used to localize the tumor in the upper outer left breast and there was a palpable mass in this area. Using one of the marked incisions and staying out of the area of the pedicle I made a generous incision over the mass and as it was fairly superficial to this small ellipse of skin overlying as well. Dissection was deepened down in the  subcutaneous tissue in all directions. Using the palpable mass in the neoprobe as a guide I did a very generous lumpectomy with cautery excising approximately 7-8 cm specimen around the mass and seed. The specimen was inked for margins. Specimen x-ray showed the marking clip and seed centrally located within the specimen. This was sent for permanent pathology. Complete hemostasis was obtained in the wound. The lumpectomy cavity was marked with clips. The breast and subcutaneous tenderness tissue was closed with interrupted 3-0 Vicryl and the skin with running subcuticular 5-0 Monocryl. Attention was turned to the sentinel lymph node biopsy. A hot area in the left axilla was identified and a small transverse incision made. Dissection was carried down through the subcutaneous tissue and the clavipectoral fascia into the axilla. Using the neoprobe for guidance I dissected out a posterior node which appeared normal size with markedly elevated counts of about 1700 ex vivo. There was another area of high counts more anteriorly and using cautery and blunt dissection I dissected another small node at the pectoral border with counts of about 700 ex vivo. At this point there was essentially no background counts in the axilla and there was no palpable adenopathy. Hemostasis was assured and the deep axillary and subcutaneous tenderness tissue closed with interrupted Vicryl. Skin closed with subcutaneous 5-0 Monocryl. Steri-Strips were applied to both incisions. Dry sterile dressing and compression garment applied. Sponge needle and instrument counts were correct.    Findings: As above  Estimated Blood Loss:  Minimal         Drains: None  Blood Given: none          Specimens: #1 left breast lumpectomy    #2  left axillary sentinel lymph nodes X 2        Complications:  * No complications entered in OR log *         Disposition: PACU - hemodynamically stable.         Condition: stable

## 2015-11-12 NOTE — Anesthesia Procedure Notes (Addendum)
Anesthesia Regional Block:  Pectoralis block  Pre-Anesthetic Checklist: ,, timeout performed, Correct Patient, Correct Site, Correct Laterality, Correct Procedure, Correct Position, site marked, Risks and benefits discussed,  Surgical consent,  Pre-op evaluation,  At surgeon's request and post-op pain management  Laterality: Left  Prep: chloraprep       Needles:   Needle Type: Echogenic Needle     Needle Length: 9cm 9 cm Needle Gauge: 21 and 21 G    Additional Needles:  Procedures: ultrasound guided (picture in chart) Pectoralis block Narrative:  Start time: 11/12/2015 11:00 AM End time: 11/12/2015 11:08 AM Injection made incrementally with aspirations every 5 mL.  Performed by: Personally  Anesthesiologist: Suzette Battiest

## 2015-11-12 NOTE — Progress Notes (Signed)
Assisted Dr. Rob Fitzgerald with left, ultrasound guided, pectoralis block. Side rails up, monitors on throughout procedure. See vital signs in flow sheet. Tolerated Procedure well. 

## 2015-11-12 NOTE — Anesthesia Procedure Notes (Signed)
Procedure Name: LMA Insertion Date/Time: 11/12/2015 11:35 AM Performed by: Lieutenant Diego Pre-anesthesia Checklist: Patient identified, Emergency Drugs available, Suction available and Patient being monitored Patient Re-evaluated:Patient Re-evaluated prior to inductionOxygen Delivery Method: Circle system utilized Preoxygenation: Pre-oxygenation with 100% oxygen Intubation Type: IV induction Ventilation: Mask ventilation without difficulty LMA: LMA inserted LMA Size: 4.0 Number of attempts: 1 Airway Equipment and Method: Bite block Placement Confirmation: positive ETCO2 and breath sounds checked- equal and bilateral Tube secured with: Tape Dental Injury: Teeth and Oropharynx as per pre-operative assessment

## 2015-11-12 NOTE — Transfer of Care (Signed)
Immediate Anesthesia Transfer of Care Note  Patient: Kelly Hancock  Procedure(s) Performed: Procedure(s): BREAST LUMPECTOMY WITH RADIOACTIVE SEED AND SENTINEL LYMPH NODE BIOPSY (Left)  Patient Location: PACU  Anesthesia Type:General and GA combined with regional for post-op pain  Level of Consciousness: awake and alert   Airway & Oxygen Therapy: Patient Spontanous Breathing and Patient connected to face mask oxygen  Post-op Assessment: Report given to RN and Post -op Vital signs reviewed and stable  Post vital signs: Reviewed and stable  Last Vitals:  Vitals:   11/12/15 0955 11/12/15 1050  BP: (!) 153/80 (!) 141/79  Pulse: 72   Resp: 18   Temp: 36.6 C     Last Pain:  Vitals:   11/12/15 0955  TempSrc: Oral         Complications: No apparent anesthesia complications

## 2015-11-12 NOTE — Anesthesia Preprocedure Evaluation (Signed)
Anesthesia Evaluation  Patient identified by MRN, date of birth, ID band Patient awake    Reviewed: Allergy & Precautions, NPO status , Patient's Chart, lab work & pertinent test results  Airway Mallampati: II  TM Distance: >3 FB Neck ROM: Full    Dental   Pulmonary neg pulmonary ROS,    breath sounds clear to auscultation       Cardiovascular negative cardio ROS   Rhythm:Regular Rate:Normal     Neuro/Psych Anxiety negative neurological ROS     GI/Hepatic negative GI ROS, Neg liver ROS,   Endo/Other  negative endocrine ROS  Renal/GU negative Renal ROS     Musculoskeletal  (+) Arthritis ,   Abdominal   Peds  Hematology negative hematology ROS (+)   Anesthesia Other Findings   Reproductive/Obstetrics                             Anesthesia Physical Anesthesia Plan  ASA: II  Anesthesia Plan: General and Regional   Post-op Pain Management:  Regional for Post-op pain   Induction: Intravenous  Airway Management Planned: LMA  Additional Equipment:   Intra-op Plan:   Post-operative Plan: Extubation in OR  Informed Consent: I have reviewed the patients History and Physical, chart, labs and discussed the procedure including the risks, benefits and alternatives for the proposed anesthesia with the patient or authorized representative who has indicated his/her understanding and acceptance.   Dental advisory given  Plan Discussed with: CRNA  Anesthesia Plan Comments:         Anesthesia Quick Evaluation

## 2015-11-12 NOTE — Interval H&P Note (Signed)
History and Physical Interval Note:  11/12/2015 11:08 AM  Kelly Hancock  has presented today for surgery, with the diagnosis of LEFT BREAST CANCER  The various methods of treatment have been discussed with the patient and family. After consideration of risks, benefits and other options for treatment, the patient has consented to  Procedure(s): BREAST LUMPECTOMY WITH RADIOACTIVE SEED AND SENTINEL LYMPH NODE BIOPSY (Left) as a surgical intervention .  The patient's history has been reviewed, patient examined, no change in status, stable for surgery.  I have reviewed the patient's chart and labs.  Questions were answered to the patient's satisfaction.     [name]

## 2015-11-13 ENCOUNTER — Encounter (HOSPITAL_BASED_OUTPATIENT_CLINIC_OR_DEPARTMENT_OTHER): Payer: Self-pay | Admitting: General Surgery

## 2015-11-19 ENCOUNTER — Telehealth: Payer: Self-pay | Admitting: *Deleted

## 2015-11-19 NOTE — Telephone Encounter (Signed)
Ordered oncotype per Dr. Burr Medico.  Faxed requisition to pathology and confirmed receipt with [facility].  Faxed PA to [facility].

## 2015-11-19 NOTE — Telephone Encounter (Signed)
Spoke with patient to discuss ordering of oncotype because there was some question on whether she wanted to have this done or not.  She informs that it would be ok to do the test if Dr. Burr Medico thinks she should and it would please her family.

## 2015-11-20 ENCOUNTER — Encounter (HOSPITAL_BASED_OUTPATIENT_CLINIC_OR_DEPARTMENT_OTHER): Payer: Self-pay | Admitting: *Deleted

## 2015-11-25 ENCOUNTER — Ambulatory Visit: Payer: Self-pay | Admitting: Plastic Surgery

## 2015-11-25 ENCOUNTER — Other Ambulatory Visit: Payer: Self-pay | Admitting: Plastic Surgery

## 2015-11-25 DIAGNOSIS — C50912 Malignant neoplasm of unspecified site of left female breast: Secondary | ICD-10-CM

## 2015-11-25 DIAGNOSIS — N62 Hypertrophy of breast: Secondary | ICD-10-CM

## 2015-11-25 DIAGNOSIS — N6489 Other specified disorders of breast: Secondary | ICD-10-CM
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
## 2015-11-26 ENCOUNTER — Encounter (HOSPITAL_BASED_OUTPATIENT_CLINIC_OR_DEPARTMENT_OTHER)
Admission: RE | Disposition: A | Discharge status: Home / Self Care | Payer: Self-pay | Source: Ambulatory Visit | Attending: Plastic Surgery

## 2015-11-26 ENCOUNTER — Ambulatory Visit (HOSPITAL_BASED_OUTPATIENT_CLINIC_OR_DEPARTMENT_OTHER)
Admission: RE | Admit: 2015-11-26 | Discharge: 2015-11-26 | Disposition: A | Discharge status: Home / Self Care | Payer: Medicare Other | Source: Ambulatory Visit | Attending: Plastic Surgery | Admitting: Plastic Surgery

## 2015-11-26 ENCOUNTER — Ambulatory Visit (HOSPITAL_BASED_OUTPATIENT_CLINIC_OR_DEPARTMENT_OTHER): Payer: Medicare Other | Admitting: Anesthesiology

## 2015-11-26 ENCOUNTER — Encounter (HOSPITAL_BASED_OUTPATIENT_CLINIC_OR_DEPARTMENT_OTHER): Payer: Self-pay | Admitting: Plastic Surgery

## 2015-11-26 DIAGNOSIS — C50912 Malignant neoplasm of unspecified site of left female breast: Secondary | ICD-10-CM

## 2015-11-26 DIAGNOSIS — M542 Cervicalgia: Secondary | ICD-10-CM | POA: Insufficient documentation

## 2015-11-26 DIAGNOSIS — M1990 Unspecified osteoarthritis, unspecified site: Secondary | ICD-10-CM | POA: Diagnosis not present

## 2015-11-26 DIAGNOSIS — Z853 Personal history of malignant neoplasm of breast: Secondary | ICD-10-CM | POA: Diagnosis not present

## 2015-11-26 DIAGNOSIS — N6489 Other specified disorders of breast: Secondary | ICD-10-CM | POA: Insufficient documentation

## 2015-11-26 DIAGNOSIS — Z96641 Presence of right artificial hip joint: Secondary | ICD-10-CM | POA: Diagnosis not present

## 2015-11-26 DIAGNOSIS — Z9012 Acquired absence of left breast and nipple: Secondary | ICD-10-CM | POA: Diagnosis not present

## 2015-11-26 DIAGNOSIS — Z91048 Other nonmedicinal substance allergy status: Secondary | ICD-10-CM | POA: Diagnosis not present

## 2015-11-26 DIAGNOSIS — N62 Hypertrophy of breast: Secondary | ICD-10-CM | POA: Insufficient documentation

## 2015-11-26 DIAGNOSIS — Z885 Allergy status to narcotic agent status: Secondary | ICD-10-CM | POA: Insufficient documentation

## 2015-11-26 DIAGNOSIS — F419 Anxiety disorder, unspecified: Secondary | ICD-10-CM | POA: Insufficient documentation

## 2015-11-26 DIAGNOSIS — Z8249 Family history of ischemic heart disease and other diseases of the circulatory system: Secondary | ICD-10-CM | POA: Diagnosis not present

## 2015-11-26 DIAGNOSIS — Z823 Family history of stroke: Secondary | ICD-10-CM | POA: Insufficient documentation

## 2015-11-26 HISTORY — DX: Nausea with vomiting, unspecified: Z98.890

## 2015-11-26 HISTORY — DX: Unspecified osteoarthritis, unspecified site: M19.90

## 2015-11-26 HISTORY — PX: LIPOSUCTION: SHX10

## 2015-11-26 HISTORY — DX: Personal history of malignant neoplasm of breast: Z85.3

## 2015-11-26 HISTORY — PX: BREAST REDUCTION SURGERY: SHX8

## 2015-11-26 HISTORY — DX: Nausea with vomiting, unspecified: R11.2

## 2015-11-26 LAB — POCT HEMOGLOBIN-HEMACUE: Hemoglobin: 14.2 g/dL (ref 12.0–15.0)

## 2015-11-26 SURGERY — MAMMOPLASTY, REDUCTION
Anesthesia: General | Site: Breast | Laterality: Bilateral

## 2015-11-26 MED ORDER — EPHEDRINE SULFATE 50 MG/ML IJ SOLN
INTRAMUSCULAR | Status: DC | PRN
  Administered 2015-11-26: 10 mg via INTRAVENOUS

## 2015-11-26 MED ORDER — DEXAMETHASONE SODIUM PHOSPHATE 10 MG/ML IJ SOLN
INTRAMUSCULAR | Status: AC
  Filled 2015-11-26: qty 1

## 2015-11-26 MED ORDER — FENTANYL CITRATE (PF) 100 MCG/2ML IJ SOLN
INTRAMUSCULAR | Status: AC
  Filled 2015-11-26: qty 2

## 2015-11-26 MED ORDER — HYDROMORPHONE HCL 1 MG/ML IJ SOLN
INTRAMUSCULAR | Status: AC
  Filled 2015-11-26: qty 1

## 2015-11-26 MED ORDER — SODIUM CHLORIDE 0.9 % IJ SOLN
INTRAMUSCULAR | Status: AC
  Filled 2015-11-26: qty 30

## 2015-11-26 MED ORDER — SODIUM CHLORIDE 0.9 % IJ SOLN
INTRAMUSCULAR | Status: AC
  Filled 2015-11-26: qty 20

## 2015-11-26 MED ORDER — LIDOCAINE-EPINEPHRINE 1 %-1:100000 IJ SOLN
INTRAMUSCULAR | Status: AC
  Filled 2015-11-26: qty 4

## 2015-11-26 MED ORDER — SODIUM CHLORIDE 0.9 % IR SOLN
Status: DC | PRN
  Administered 2015-11-26: 500 mL

## 2015-11-26 MED ORDER — PROPOFOL 10 MG/ML IV BOLUS
INTRAVENOUS | Status: DC | PRN
  Administered 2015-11-26: 150 mg via INTRAVENOUS

## 2015-11-26 MED ORDER — MIDAZOLAM HCL 2 MG/2ML IJ SOLN
1.0000 mg | INTRAMUSCULAR | Status: DC | PRN
  Administered 2015-11-26: 2 mg via INTRAVENOUS

## 2015-11-26 MED ORDER — HYDROCODONE-ACETAMINOPHEN 7.5-325 MG PO TABS: 1.0000 | ORAL_TABLET | Freq: Once | ORAL | Status: DC | PRN

## 2015-11-26 MED ORDER — FENTANYL CITRATE (PF) 100 MCG/2ML IJ SOLN
INTRAMUSCULAR | Status: DC | PRN
  Administered 2015-11-26 (×4): 50 ug via INTRAVENOUS
  Administered 2015-11-26 (×2): 25 ug via INTRAVENOUS
  Administered 2015-11-26: 50 ug via INTRAVENOUS
  Administered 2015-11-26: 100 ug via INTRAVENOUS

## 2015-11-26 MED ORDER — FENTANYL CITRATE (PF) 100 MCG/2ML IJ SOLN
INTRAMUSCULAR | Status: AC
Start: 2015-11-26 — End: 2015-11-26
  Filled 2015-11-26: qty 2

## 2015-11-26 MED ORDER — PROPOFOL 500 MG/50ML IV EMUL
INTRAVENOUS | Status: AC
  Filled 2015-11-26: qty 50

## 2015-11-26 MED ORDER — EPHEDRINE 5 MG/ML INJ
INTRAVENOUS | Status: AC
  Filled 2015-11-26: qty 10

## 2015-11-26 MED ORDER — SODIUM CHLORIDE 0.9 % IJ SOLN
INTRAMUSCULAR | Status: DC | PRN
  Administered 2015-11-26: 15 mL

## 2015-11-26 MED ORDER — LIDOCAINE 2% (20 MG/ML) 5 ML SYRINGE
INTRAMUSCULAR | Status: AC
  Filled 2015-11-26: qty 5

## 2015-11-26 MED ORDER — SUCCINYLCHOLINE CHLORIDE 200 MG/10ML IV SOSY
PREFILLED_SYRINGE | INTRAVENOUS | Status: AC
Start: 2015-11-26 — End: 2015-11-26
  Filled 2015-11-26: qty 10

## 2015-11-26 MED ORDER — GLYCOPYRROLATE 0.2 MG/ML IJ SOLN
0.2000 mg | Freq: Once | INTRAMUSCULAR | Status: AC | PRN
  Administered 2015-11-26: 0.2 mg via INTRAVENOUS

## 2015-11-26 MED ORDER — LIDOCAINE HCL (CARDIAC) 20 MG/ML IV SOLN
INTRAVENOUS | Status: DC | PRN
  Administered 2015-11-26: 50 mg via INTRAVENOUS

## 2015-11-26 MED ORDER — HYDROMORPHONE HCL 1 MG/ML IJ SOLN
0.2500 mg | INTRAMUSCULAR | Status: DC | PRN
  Administered 2015-11-26: 0.5 mg via INTRAVENOUS
  Administered 2015-11-26: 0.25 mg via INTRAVENOUS
  Administered 2015-11-26: 0.5 mg via INTRAVENOUS

## 2015-11-26 MED ORDER — KETOROLAC TROMETHAMINE 30 MG/ML IJ SOLN
15.0000 mg | Freq: Once | INTRAMUSCULAR | Status: AC
  Administered 2015-11-26: 15 mg via INTRAVENOUS

## 2015-11-26 MED ORDER — CHLORHEXIDINE GLUCONATE CLOTH 2 % EX PADS: 6.0000 | MEDICATED_PAD | Freq: Once | CUTANEOUS | Status: DC

## 2015-11-26 MED ORDER — METOCLOPRAMIDE HCL 5 MG/ML IJ SOLN
INTRAMUSCULAR | Status: AC
  Filled 2015-11-26: qty 2

## 2015-11-26 MED ORDER — PROMETHAZINE HCL 25 MG/ML IJ SOLN: 6.2500 mg | INTRAMUSCULAR | Status: DC | PRN

## 2015-11-26 MED ORDER — METOCLOPRAMIDE HCL 5 MG/ML IJ SOLN
5.0000 mg | Freq: Once | INTRAMUSCULAR | Status: AC
  Administered 2015-11-26: 5 mg via INTRAVENOUS

## 2015-11-26 MED ORDER — CEFAZOLIN SODIUM-DEXTROSE 2-4 GM/100ML-% IV SOLN
INTRAVENOUS | Status: AC
  Filled 2015-11-26: qty 100

## 2015-11-26 MED ORDER — MIDAZOLAM HCL 2 MG/2ML IJ SOLN
INTRAMUSCULAR | Status: AC
  Filled 2015-11-26: qty 2

## 2015-11-26 MED ORDER — CEFAZOLIN SODIUM-DEXTROSE 2-4 GM/100ML-% IV SOLN
2.0000 g | INTRAVENOUS | Status: AC
  Administered 2015-11-26: 2 g via INTRAVENOUS

## 2015-11-26 MED ORDER — ONDANSETRON 4 MG PO TBDP
4.0000 mg | ORAL_TABLET | Freq: Once | ORAL | Status: AC
  Administered 2015-11-26: 4 mg via ORAL

## 2015-11-26 MED ORDER — PHENYLEPHRINE 40 MCG/ML (10ML) SYRINGE FOR IV PUSH (FOR BLOOD PRESSURE SUPPORT)
PREFILLED_SYRINGE | INTRAVENOUS | Status: AC
  Filled 2015-11-26: qty 10

## 2015-11-26 MED ORDER — BUPIVACAINE-EPINEPHRINE (PF) 0.25% -1:200000 IJ SOLN
INTRAMUSCULAR | Status: AC
  Filled 2015-11-26: qty 30

## 2015-11-26 MED ORDER — ONDANSETRON HCL 4 MG/2ML IJ SOLN
INTRAMUSCULAR | Status: AC
  Filled 2015-11-26: qty 2

## 2015-11-26 MED ORDER — SCOPOLAMINE 1 MG/3DAYS TD PT72: 1.0000 | MEDICATED_PATCH | Freq: Once | TRANSDERMAL | Status: DC | PRN

## 2015-11-26 MED ORDER — LIDOCAINE-EPINEPHRINE 1 %-1:100000 IJ SOLN
INTRAMUSCULAR | Status: DC | PRN
  Administered 2015-11-26: 25 mL

## 2015-11-26 MED ORDER — LACTATED RINGERS IV SOLN
INTRAVENOUS | Status: DC
  Administered 2015-11-26 (×2): via INTRAVENOUS

## 2015-11-26 MED ORDER — NEOSTIGMINE METHYLSULFATE 10 MG/10ML IV SOLN
INTRAVENOUS | Status: DC | PRN
  Administered 2015-11-26: 2 mg via INTRAVENOUS

## 2015-11-26 MED ORDER — ROCURONIUM BROMIDE 100 MG/10ML IV SOLN
INTRAVENOUS | Status: DC | PRN
  Administered 2015-11-26: 50 mg via INTRAVENOUS

## 2015-11-26 MED ORDER — DEXAMETHASONE SODIUM PHOSPHATE 4 MG/ML IJ SOLN
INTRAMUSCULAR | Status: DC | PRN
  Administered 2015-11-26: 10 mg via INTRAVENOUS

## 2015-11-26 MED ORDER — ROCURONIUM BROMIDE 10 MG/ML (PF) SYRINGE
PREFILLED_SYRINGE | INTRAVENOUS | Status: AC
  Filled 2015-11-26: qty 10

## 2015-11-26 MED ORDER — KETOROLAC TROMETHAMINE 30 MG/ML IJ SOLN
INTRAMUSCULAR | Status: AC
  Filled 2015-11-26: qty 1

## 2015-11-26 MED ORDER — ONDANSETRON HCL 4 MG/2ML IJ SOLN
INTRAMUSCULAR | Status: DC | PRN
  Administered 2015-11-26: 4 mg via INTRAVENOUS

## 2015-11-26 MED ORDER — FENTANYL CITRATE (PF) 100 MCG/2ML IJ SOLN: 50.0000 ug | INTRAMUSCULAR | Status: DC | PRN

## 2015-11-26 MED ORDER — ONDANSETRON 4 MG PO TBDP
ORAL_TABLET | ORAL | Status: AC
  Filled 2015-11-26: qty 1

## 2015-11-26 SURGICAL SUPPLY — 65 items
BAG DECANTER FOR FLEXI CONT (MISCELLANEOUS) ×2 IMPLANT
BINDER BREAST LRG (GAUZE/BANDAGES/DRESSINGS) IMPLANT
BINDER BREAST MEDIUM (GAUZE/BANDAGES/DRESSINGS) IMPLANT
BINDER BREAST XLRG (GAUZE/BANDAGES/DRESSINGS) ×2 IMPLANT
BINDER BREAST XXLRG (GAUZE/BANDAGES/DRESSINGS) IMPLANT
BLADE HEX COATED 2.75 (ELECTRODE) ×2 IMPLANT
BLADE KNIFE PERSONA 10 (BLADE) ×10 IMPLANT
BLADE SURG 15 STRL LF DISP TIS (BLADE) IMPLANT
BLADE SURG 15 STRL SS (BLADE)
BNDG GAUZE ELAST 4 BULKY (GAUZE/BANDAGES/DRESSINGS) ×4 IMPLANT
CANISTER SUCT 1200ML W/VALVE (MISCELLANEOUS) ×2 IMPLANT
CHLORAPREP W/TINT 26ML (MISCELLANEOUS) ×2 IMPLANT
COVER BACK TABLE 60X90IN (DRAPES) ×2 IMPLANT
COVER MAYO STAND STRL (DRAPES) ×2 IMPLANT
DECANTER SPIKE VIAL GLASS SM (MISCELLANEOUS) IMPLANT
DERMABOND ADVANCED (GAUZE/BANDAGES/DRESSINGS) ×2
DERMABOND ADVANCED .7 DNX12 (GAUZE/BANDAGES/DRESSINGS) ×2 IMPLANT
DRAIN CHANNEL 19F RND (DRAIN) IMPLANT
DRAPE LAPAROSCOPIC ABDOMINAL (DRAPES) ×2 IMPLANT
DRSG PAD ABDOMINAL 8X10 ST (GAUZE/BANDAGES/DRESSINGS) ×4 IMPLANT
ELECT BLADE 4.0 EZ CLEAN MEGAD (MISCELLANEOUS)
ELECT REM PT RETURN 9FT ADLT (ELECTROSURGICAL) ×2
ELECTRODE BLDE 4.0 EZ CLN MEGD (MISCELLANEOUS) IMPLANT
ELECTRODE REM PT RTRN 9FT ADLT (ELECTROSURGICAL) ×1 IMPLANT
EVACUATOR SILICONE 100CC (DRAIN) IMPLANT
FILTER LIPOSUCTION (MISCELLANEOUS) IMPLANT
GLOVE BIO SURGEON STRL SZ 6.5 (GLOVE) ×10 IMPLANT
GLOVE BIOGEL PI IND STRL 7.0 (GLOVE) ×1 IMPLANT
GLOVE BIOGEL PI INDICATOR 7.0 (GLOVE) ×1
GLOVE ECLIPSE 6.5 STRL STRAW (GLOVE) ×2 IMPLANT
GOWN STRL REUS W/ TWL LRG LVL3 (GOWN DISPOSABLE) ×3 IMPLANT
GOWN STRL REUS W/TWL LRG LVL3 (GOWN DISPOSABLE) ×3
NDL SAFETY ECLIPSE 18X1.5 (NEEDLE) IMPLANT
NEEDLE HYPO 18GX1.5 SHARP (NEEDLE)
NEEDLE HYPO 25X1 1.5 SAFETY (NEEDLE) ×2 IMPLANT
NEEDLE SPNL 18GX3.5 QUINCKE PK (NEEDLE) ×2 IMPLANT
NS IRRIG 1000ML POUR BTL (IV SOLUTION) ×2 IMPLANT
PACK BASIN DAY SURGERY FS (CUSTOM PROCEDURE TRAY) ×2 IMPLANT
PAD ALCOHOL SWAB (MISCELLANEOUS) IMPLANT
PENCIL BUTTON HOLSTER BLD 10FT (ELECTRODE) ×2 IMPLANT
SLEEVE SCD COMPRESS KNEE MED (MISCELLANEOUS) ×2 IMPLANT
SPONGE LAP 18X18 X RAY DECT (DISPOSABLE) ×8 IMPLANT
STRIP SUTURE WOUND CLOSURE 1/2 (SUTURE) IMPLANT
SUT MNCRL AB 4-0 PS2 18 (SUTURE) ×10 IMPLANT
SUT MON AB 3-0 SH 27 (SUTURE) ×4
SUT MON AB 3-0 SH27 (SUTURE) ×4 IMPLANT
SUT MON AB 5-0 PS2 18 (SUTURE) ×10 IMPLANT
SUT PDS 3-0 CT2 (SUTURE)
SUT PDS AB 2-0 CT2 27 (SUTURE) IMPLANT
SUT PDS II 3-0 CT2 27 ABS (SUTURE) IMPLANT
SUT SILK 3 0 PS 1 (SUTURE) IMPLANT
SUT VIC AB 3-0 SH 27 (SUTURE)
SUT VIC AB 3-0 SH 27X BRD (SUTURE) IMPLANT
SUT VICRYL 4-0 PS2 18IN ABS (SUTURE) IMPLANT
SYR 3ML 23GX1 SAFETY (SYRINGE) ×2 IMPLANT
SYR 50ML LL SCALE MARK (SYRINGE) IMPLANT
SYR BULB IRRIGATION 50ML (SYRINGE) ×2 IMPLANT
SYR CONTROL 10ML LL (SYRINGE) ×2 IMPLANT
TAPE MEASURE VINYL STERILE (MISCELLANEOUS) ×2 IMPLANT
TOWEL OR 17X24 6PK STRL BLUE (TOWEL DISPOSABLE) ×4 IMPLANT
TUBE CONNECTING 20X1/4 (TUBING) ×2 IMPLANT
TUBING INFILTRATION IT-10001 (TUBING) IMPLANT
TUBING SET GRADUATE ASPIR 12FT (MISCELLANEOUS) ×2 IMPLANT
UNDERPAD 30X30 (UNDERPADS AND DIAPERS) ×4 IMPLANT
YANKAUER SUCT BULB TIP NO VENT (SUCTIONS) ×2 IMPLANT

## 2015-11-26 NOTE — Anesthesia Postprocedure Evaluation (Signed)
Anesthesia Post Note  Patient: Kelly Hancock  Procedure(s) Performed: Procedure(s) (LRB): MAMMARY REDUCTION  (BREAST) FOR ASYMMETRY (Bilateral) LIPOSUCTION (Bilateral)  Patient location during evaluation: PACU Anesthesia Type: General Level of consciousness: awake and alert Pain management: pain level controlled Vital Signs Assessment: post-procedure vital signs reviewed and stable Respiratory status: spontaneous breathing, nonlabored ventilation, respiratory function stable and patient connected to nasal cannula oxygen Cardiovascular status: blood pressure returned to baseline and stable Postop Assessment: no signs of nausea or vomiting Anesthetic complications: no    Last Vitals:  Vitals:   11/26/15 1300 11/26/15 1315  BP: 136/76 134/82  Pulse: (!) 101 96  Resp: 14 15  Temp:      Last Pain:  Vitals:   11/26/15 1232  TempSrc:   PainSc: 0-No pain                 [name] S
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
## 2015-11-26 NOTE — Op Note (Signed)
Breast Reduction Op note:    DATE OF PROCEDURE: 11/26/2015  LOCATION: [facility]  SURGEON: Lyndee Leo Sanger Jayma Volpi, DO  ASSISTANT: Shawn Rayburn, PA  PREOPERATIVE DIAGNOSIS 1. Left breast cancer 2. Post operative breast asymmetry  3. Bilateral Macromastia 4. Neck Pain / Back Pain  POSTOPERATIVE DIAGNOSIS 1. Left breast cancer 2. Post operative breast asymmetry  3. Bilateral Macromastia 4. Neck Pain / Back Pain  PROCEDURES 1. Bilateral breast reduction.  Right reduction 527g, Left reduction [id number]  COMPLICATIONS: None.  DRAINS: none  INDICATIONS FOR PROCEDURE Kelly Hancock is a 70 y.o. year old female born on 1945/08/01, with a history of symptomatic macromastia with concominant back pain, neck pain, shoulder grooving from her bra.  She underwent a left partial mastectomy for breast cancer with clear margins.  She had resulting breast asymmetry. MRN: [id number]  CONSENT Informed consent was obtained directly from the patient. The risks, benefits and alternatives were fully discussed. Specific risks including but not limited to bleeding, infection, hematoma, seroma, scarring, pain, nipple necrosis, asymmetry, poor cosmetic results, and need for further surgery were discussed. The patient had ample opportunity to have her questions answered to her satisfaction.  DESCRIPTION OF PROCEDURE  Patient was brought into the operating room and placed in a supine position.  SCDs were placed and appropriate padding was performed.  Antibiotics were given. The patient underwent general anesthesia and the chest was prepped and draped in a sterile fashion.  A timeout was performed and all information was confirmed to be correct.  Right: Preoperative markings were confirmed.  Incision lines were injected with 1% Xylocaine with epinephrine.  After waiting for vasoconstriction, the marked lines were incised.  An inferior pedical breast reduction was performed by  de-epithelializing the pedicle, using bovie to create the lateral and medial pedicles, and removing breast tissue from the superior, lateral, and medial portions of the breast.  Care was taken to not undermine the breast pedicle. Hemostasis was achieved.  The nipple was gently rotated into position and the skin was temporarily closed with staples.  The patient was sat upright and size and shape symmetry was confirmed.  The pocket was irrigated and hemostasis confirmed.  The deep tissues were approximated with 3-0 Monocryl sutures and the skin was closed with deep dermal and subcuticular 4-0 Monocryl sutures followed by 5-0 Monocryl.  The nipple areola complex was brought out with the skin de-epithelialized at the location to make place for the complex.  The area was secured with 4-0 Monocryl at the deep layers followed by 5-0 Monocryl.  Liposuction was done laterally for better contour.  Left: Preoperative markings were confirmed.  Incision lines were injected with 1% Xylocaine with epinephrine.  After waiting for vasoconstriction, the marked lines were incised.  An inferior pedical breast reduction was performed by de-epithelializing the pedicle, using bovie to create the lateral and medial pedicles, and removing breast tissue from the superior, lateral, and medial portions of the breast.  Care was taken to not undermine the breast pedicle. Hemostasis was achieved.  The nipple was gently rotated into position and the skin was temporarily closed with staples.  The patient was sat upright and size and shape symmetry was confirmed.  The pocket was irrigated and hemostasis confirmed.  The deep tissues were approximated with 3-0 Monocryl sutures and the skin was closed with deep dermal and subcuticular 4-0 Monocryl sutures followed by 5-0 Monocryl.  The nipple areola complex was brought out with the skin de-epithelialized at the location  to make place for the complex.  The area was secured with 4-0 Monocryl at the  deep layers followed by 5-0 Monocryl.  Liposuction was done laterally for better contour. The nipple and skin flaps had good capillary refill at the end of the procedure. The patient tolerated the procedure well. The patient was allowed to wake from anesthesia and taken to the recovery room in satisfactory condition.

## 2015-11-26 NOTE — Anesthesia Preprocedure Evaluation (Signed)
Anesthesia Evaluation  Patient identified by MRN, date of birth, ID band Patient awake    Reviewed: Allergy & Precautions, NPO status , Patient's Chart, lab work & pertinent test results  Airway Mallampati: II  TM Distance: >3 FB Neck ROM: Full    Dental   Pulmonary neg pulmonary ROS,    breath sounds clear to auscultation       Cardiovascular negative cardio ROS   Rhythm:Regular Rate:Normal     Neuro/Psych Anxiety negative neurological ROS     GI/Hepatic negative GI ROS, Neg liver ROS,   Endo/Other  negative endocrine ROS  Renal/GU negative Renal ROS     Musculoskeletal  (+) Arthritis ,   Abdominal   Peds  Hematology negative hematology ROS (+)   Anesthesia Other Findings   Reproductive/Obstetrics                             Anesthesia Physical  Anesthesia Plan  ASA: II  Anesthesia Plan: General   Post-op Pain Management:    Induction: Intravenous  Airway Management Planned: LMA  Additional Equipment:   Intra-op Plan:   Post-operative Plan: Extubation in OR  Informed Consent: I have reviewed the patients History and Physical, chart, labs and discussed the procedure including the risks, benefits and alternatives for the proposed anesthesia with the patient or authorized representative who has indicated his/her understanding and acceptance.   Dental advisory given  Plan Discussed with: CRNA  Anesthesia Plan Comments:         Anesthesia Quick Evaluation

## 2015-11-26 NOTE — Anesthesia Procedure Notes (Signed)
Procedure Name: Intubation Date/Time: 11/26/2015 9:17 AM Performed by: Melynda Ripple D Pre-anesthesia Checklist: Patient identified, Emergency Drugs available, Suction available and Patient being monitored Patient Re-evaluated:Patient Re-evaluated prior to inductionOxygen Delivery Method: Circle system utilized Preoxygenation: Pre-oxygenation with 100% oxygen Intubation Type: IV induction Ventilation: Mask ventilation without difficulty Laryngoscope Size: Mac and 3 Grade View: Grade II Tube type: Oral Tube size: 7.0 mm Number of attempts: 1 Airway Equipment and Method: Stylet and Oral airway Placement Confirmation: ETT inserted through vocal cords under direct vision,  positive ETCO2 and breath sounds checked- equal and bilateral Secured at: 22 cm Tube secured with: Tape Dental Injury: Teeth and Oropharynx as per pre-operative assessment

## 2015-11-26 NOTE — Interval H&P Note (Signed)
History and Physical Interval Note:  11/26/2015 9:03 AM  Kelly Hancock  has presented today for surgery, with the diagnosis of BREAST CANCER  The various methods of treatment have been discussed with the patient and family. After consideration of risks, benefits and other options for treatment, the patient has consented to  Procedure(s): MAMMARY REDUCTION  (BREAST) FOR ASYMMETRY (Bilateral) as a surgical intervention .  The patient's history has been reviewed, patient examined, no change in status, stable for surgery.  I have reviewed the patient's chart and labs.  Questions were answered to the patient's satisfaction.     Wallace Going

## 2015-11-26 NOTE — Discharge Instructions (Signed)
May shower tomorrow Continue binder or bra No heavy lifting  Call your surgeon if you experience:   1.  Fever over 101.0. 2.  Inability to urinate. 3.  Nausea and/or vomiting. 4.  Extreme swelling or bruising at the surgical site. 5.  Continued bleeding from the incision. 6.  Increased pain, redness or drainage from the incision. 7.  Problems related to your pain medication. 8.  Any problems and/or concerns  Post Anesthesia Home Care Instructions  Activity: Get plenty of rest for the remainder of the day. A responsible adult should stay with you for 24 hours following the procedure.  For the next 24 hours, DO NOT: -Drive a car -Paediatric nurse -Drink alcoholic beverages -Take any medication unless instructed by your physician -Make any legal decisions or sign important papers.  Meals: Start with liquid foods such as gelatin or soup. Progress to regular foods as tolerated. Avoid greasy, spicy, heavy foods. If nausea and/or vomiting occur, drink only clear liquids until the nausea and/or vomiting subsides. Call your physician if vomiting continues.  Special Instructions/Symptoms: Your throat may feel dry or sore from the anesthesia or the breathing tube placed in your throat during surgery. If this causes discomfort, gargle with warm salt water. The discomfort should disappear within 24 hours.  If you had a scopolamine patch placed behind your ear for the management of post- operative nausea and/or vomiting:  1. The medication in the patch is effective for 72 hours, after which it should be removed.  Wrap patch in a tissue and discard in the trash. Wash hands thoroughly with soap and water. 2. You may remove the patch earlier than 72 hours if you experience unpleasant side effects which may include dry mouth, dizziness or visual disturbances. 3. Avoid touching the patch. Wash your hands with soap and water after contact with the patch.

## 2015-11-26 NOTE — Transfer of Care (Signed)
Immediate Anesthesia Transfer of Care Note  Patient: Kelly Hancock  Procedure(s) Performed: Procedure(s): MAMMARY REDUCTION  (BREAST) FOR ASYMMETRY (Bilateral) LIPOSUCTION (Bilateral)  Patient Location: PACU  Anesthesia Type:General  Level of Consciousness: awake  Airway & Oxygen Therapy: Patient Spontanous Breathing and Patient connected to face mask oxygen  Post-op Assessment: Report given to RN and Post -op Vital signs reviewed and stable  Post vital signs: Reviewed and stable  Last Vitals:  Vitals:   11/26/15 0842  BP: (!) 177/82  Pulse: 71  Resp: 18  Temp: 36.4 C    Last Pain:  Vitals:   11/26/15 0842  TempSrc: Oral  PainSc: 0-No pain      Patients Stated Pain Goal: 0 ([id number])  Complications: No apparent anesthesia complications

## 2015-11-26 NOTE — Brief Op Note (Signed)
11/26/2015  12:26 PM  PATIENT:  Kelly Hancock  70 y.o. female  PRE-OPERATIVE DIAGNOSIS:  BREAST CANCER  POST-OPERATIVE DIAGNOSIS:  BREAST CANCER  PROCEDURE:  Procedure(s): MAMMARY REDUCTION  (BREAST) FOR ASYMMETRY (Bilateral) LIPOSUCTION (Bilateral)  SURGEON:  Surgeon(s) and Role:    * Loel Lofty Dillingham, DO - Primary  PHYSICIAN ASSISTANT: Shawn Rayburn, PA  ASSISTANTS: none   ANESTHESIA:   general  EBL:  Total I/O In: 2000 [I.V.:2000] Out: 25 [Blood:25]  BLOOD ADMINISTERED:none  DRAINS: none   LOCAL MEDICATIONS USED:  LIDOCAINE   SPECIMEN:  Source of Specimen:  bilateral breast tissue  DISPOSITION OF SPECIMEN:  PATHOLOGY  COUNTS:  YES  TOURNIQUET:  * No tourniquets in log *  DICTATION: .Dragon Dictation  PLAN OF CARE: Discharge to home after PACU  PATIENT DISPOSITION:  PACU - hemodynamically stable.   Delay start of Pharmacological VTE agent (>24hrs) due to surgical blood loss or risk of bleeding: no

## 2015-11-28 ENCOUNTER — Encounter (HOSPITAL_BASED_OUTPATIENT_CLINIC_OR_DEPARTMENT_OTHER): Payer: Self-pay | Admitting: Plastic Surgery

## 2015-12-03 ENCOUNTER — Telehealth: Payer: Self-pay | Admitting: *Deleted

## 2015-12-03 NOTE — Telephone Encounter (Signed)
Spoke with patient to give her the oncotype results of 10 and no need for chemo.  She wants to wait until she sees Dr. Burr Medico before deciding on XRT.  She states she may not want to do anything.  She is aware of her appt with Dr. Burr Medico 9/25.  Encouraged her to call with any needs or concerns.

## 2015-12-04 ENCOUNTER — Telehealth: Payer: Self-pay | Admitting: *Deleted

## 2015-12-04 NOTE — Telephone Encounter (Signed)
Received Oncotype Dx results of 10/7%.  Placed a copy in Dr. Ernestina Penna box, gave Varney Biles a copy and took a copy to HIM to scan.

## 2015-12-08 ENCOUNTER — Encounter: Payer: Self-pay | Admitting: Hematology

## 2015-12-08 ENCOUNTER — Ambulatory Visit (HOSPITAL_BASED_OUTPATIENT_CLINIC_OR_DEPARTMENT_OTHER): Payer: Medicare Other | Admitting: Hematology

## 2015-12-08 ENCOUNTER — Telehealth: Payer: Self-pay | Admitting: Hematology

## 2015-12-08 VITALS — BP 144/86 | HR 80 | Temp 97.9°F | Resp 18 | Ht 66.0 in | Wt 145.8 lb

## 2015-12-08 DIAGNOSIS — I10 Essential (primary) hypertension: Secondary | ICD-10-CM | POA: Diagnosis not present

## 2015-12-08 DIAGNOSIS — C50412 Malignant neoplasm of upper-outer quadrant of left female breast: Secondary | ICD-10-CM | POA: Diagnosis not present

## 2015-12-08 DIAGNOSIS — K219 Gastro-esophageal reflux disease without esophagitis: Secondary | ICD-10-CM | POA: Diagnosis not present

## 2015-12-08 DIAGNOSIS — M1990 Unspecified osteoarthritis, unspecified site: Secondary | ICD-10-CM | POA: Diagnosis not present

## 2015-12-08 NOTE — Telephone Encounter (Signed)
Gave patient avs report and appointments for November.  °
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
## 2015-12-18 ENCOUNTER — Encounter (HOSPITAL_COMMUNITY): Payer: Self-pay

## 2015-12-29 ENCOUNTER — Telehealth: Payer: Self-pay | Admitting: *Deleted

## 2015-12-29 NOTE — Telephone Encounter (Signed)
Spoke to pt concerning her decision on further treatment. Pt relate she is not going to have radiation or AI. Informed pt that I will discuss with physician team. Confirmed f/u appt with Dr. Burr Medico on 02/02/16. Denies further needs or questions at this time.

## 2016-02-02 ENCOUNTER — Encounter: Payer: Self-pay | Admitting: Hematology

## 2016-02-02 ENCOUNTER — Other Ambulatory Visit (HOSPITAL_BASED_OUTPATIENT_CLINIC_OR_DEPARTMENT_OTHER): Payer: Medicare Other

## 2016-02-02 ENCOUNTER — Ambulatory Visit (HOSPITAL_BASED_OUTPATIENT_CLINIC_OR_DEPARTMENT_OTHER): Payer: Medicare Other | Admitting: Hematology

## 2016-02-02 ENCOUNTER — Telehealth: Payer: Self-pay | Admitting: Hematology

## 2016-02-02 VITALS — BP 146/85 | HR 74 | Temp 98.3°F | Resp 18 | Ht 66.0 in | Wt 142.5 lb

## 2016-02-02 DIAGNOSIS — I10 Essential (primary) hypertension: Secondary | ICD-10-CM | POA: Diagnosis not present

## 2016-02-02 DIAGNOSIS — K219 Gastro-esophageal reflux disease without esophagitis: Secondary | ICD-10-CM | POA: Diagnosis not present

## 2016-02-02 DIAGNOSIS — C50412 Malignant neoplasm of upper-outer quadrant of left female breast: Secondary | ICD-10-CM | POA: Diagnosis not present

## 2016-02-02 DIAGNOSIS — Z170 Estrogen receptor positive status [ER+]: Secondary | ICD-10-CM

## 2016-02-02 LAB — CBC WITH DIFFERENTIAL/PLATELET
BASO%: 0.6 % (ref 0.0–2.0)
BASOS ABS: 0 10*3/uL (ref 0.0–0.1)
EOS ABS: 0.1 10*3/uL (ref 0.0–0.5)
EOS%: 2.2 % (ref 0.0–7.0)
HEMATOCRIT: 40 % (ref 34.8–46.6)
HEMOGLOBIN: 12.9 g/dL (ref 11.6–15.9)
LYMPH#: 1.4 10*3/uL (ref 0.9–3.3)
LYMPH%: 23.9 % (ref 14.0–49.7)
MCH: 26.6 pg (ref 25.1–34.0)
MCHC: 32.2 g/dL (ref 31.5–36.0)
MCV: 82.7 fL (ref 79.5–101.0)
MONO#: 0.6 10*3/uL (ref 0.1–0.9)
MONO%: 10.5 % (ref 0.0–14.0)
NEUT#: 3.7 10*3/uL (ref 1.5–6.5)
NEUT%: 62.8 % (ref 38.4–76.8)
PLATELETS: 268 10*3/uL (ref 145–400)
RBC: 4.83 10*6/uL (ref 3.70–5.45)
RDW: 13.2 % (ref 11.2–14.5)
WBC: 6 10*3/uL (ref 3.9–10.3)

## 2016-02-02 LAB — COMPREHENSIVE METABOLIC PANEL
ALBUMIN: 3.7 g/dL (ref 3.5–5.0)
ALK PHOS: 85 U/L (ref 40–150)
ALT: 18 U/L (ref 0–55)
ANION GAP: 9 meq/L (ref 3–11)
AST: 18 U/L (ref 5–34)
BUN: 14.3 mg/dL (ref 7.0–26.0)
CALCIUM: 10 mg/dL (ref 8.4–10.4)
CO2: 24 mEq/L (ref 22–29)
Chloride: 107 mEq/L (ref 98–109)
Creatinine: 0.7 mg/dL (ref 0.6–1.1)
EGFR: 88 mL/min/{1.73_m2} — AB (ref >90)
Glucose: 98 mg/dl (ref 70–140)
POTASSIUM: 4.1 meq/L (ref 3.5–5.1)
Sodium: 139 mEq/L (ref 136–145)
Total Bilirubin: 0.4 mg/dL (ref 0.20–1.20)
Total Protein: 7.2 g/dL (ref 6.4–8.3)

## 2016-02-02 NOTE — Telephone Encounter (Signed)
Appointments scheduled per 02/02/16 los. A copy of the AVS report & appointment schedule was given to patient,per 02/02/16 los. °
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
## 2016-05-31 ENCOUNTER — Ambulatory Visit (HOSPITAL_BASED_OUTPATIENT_CLINIC_OR_DEPARTMENT_OTHER): Payer: Medicare HMO | Admitting: Hematology

## 2016-05-31 ENCOUNTER — Other Ambulatory Visit (HOSPITAL_BASED_OUTPATIENT_CLINIC_OR_DEPARTMENT_OTHER): Payer: Medicare HMO

## 2016-05-31 ENCOUNTER — Telehealth: Payer: Self-pay | Admitting: Hematology

## 2016-05-31 VITALS — BP 177/93 | HR 69 | Temp 97.9°F | Resp 18 | Ht 66.0 in | Wt 145.5 lb

## 2016-05-31 DIAGNOSIS — C50412 Malignant neoplasm of upper-outer quadrant of left female breast: Secondary | ICD-10-CM | POA: Diagnosis not present

## 2016-05-31 DIAGNOSIS — K219 Gastro-esophageal reflux disease without esophagitis: Secondary | ICD-10-CM

## 2016-05-31 DIAGNOSIS — I10 Essential (primary) hypertension: Secondary | ICD-10-CM | POA: Diagnosis not present

## 2016-05-31 DIAGNOSIS — Z170 Estrogen receptor positive status [ER+]: Secondary | ICD-10-CM

## 2016-05-31 LAB — CBC WITH DIFFERENTIAL/PLATELET
BASO%: 0.2 % (ref 0.0–2.0)
Basophils Absolute: 0 10*3/uL (ref 0.0–0.1)
EOS ABS: 0.1 10*3/uL (ref 0.0–0.5)
EOS%: 1.9 % (ref 0.0–7.0)
HEMATOCRIT: 41.9 % (ref 34.8–46.6)
HGB: 14 g/dL (ref 11.6–15.9)
LYMPH#: 2 10*3/uL (ref 0.9–3.3)
LYMPH%: 37.2 % (ref 14.0–49.7)
MCH: 27.3 pg (ref 25.1–34.0)
MCHC: 33.4 g/dL (ref 31.5–36.0)
MCV: 81.8 fL (ref 79.5–101.0)
MONO#: 0.5 10*3/uL (ref 0.1–0.9)
MONO%: 9.9 % (ref 0.0–14.0)
NEUT%: 50.8 % (ref 38.4–76.8)
NEUTROS ABS: 2.7 10*3/uL (ref 1.5–6.5)
PLATELETS: 234 10*3/uL (ref 145–400)
RBC: 5.12 10*6/uL (ref 3.70–5.45)
RDW: 14 % (ref 11.2–14.5)
WBC: 5.4 10*3/uL (ref 3.9–10.3)

## 2016-05-31 LAB — COMPREHENSIVE METABOLIC PANEL
ALK PHOS: 91 U/L (ref 40–150)
ALT: 28 U/L (ref 0–55)
ANION GAP: 11 meq/L (ref 3–11)
AST: 23 U/L (ref 5–34)
Albumin: 4 g/dL (ref 3.5–5.0)
BILIRUBIN TOTAL: 0.5 mg/dL (ref 0.20–1.20)
BUN: 14.3 mg/dL (ref 7.0–26.0)
CALCIUM: 10 mg/dL (ref 8.4–10.4)
CO2: 22 mEq/L (ref 22–29)
CREATININE: 0.7 mg/dL (ref 0.6–1.1)
Chloride: 107 mEq/L (ref 98–109)
EGFR: 85 mL/min/{1.73_m2} — ABNORMAL LOW (ref >90)
Glucose: 99 mg/dl (ref 70–140)
Potassium: 4 mEq/L (ref 3.5–5.1)
Sodium: 141 mEq/L (ref 136–145)
TOTAL PROTEIN: 7.2 g/dL (ref 6.4–8.3)

## 2016-05-31 NOTE — Telephone Encounter (Signed)
Appointments scheduled per 3.19.18 LOS. Patient given AVS report and calendars with future scheduled appointments.  °

## 2016-06-01 ENCOUNTER — Encounter: Payer: Self-pay | Admitting: Hematology

## 2016-06-09 ENCOUNTER — Telehealth: Payer: Self-pay | Admitting: Adult Health

## 2016-06-09 NOTE — Telephone Encounter (Signed)
The Survivorship Care Plan was mailed to Kelly Hancock as she reported not being able to come in to the Survivorship Clinic for an in-person visit at this time. A letter was mailed to her outlining the purpose of the content of the care plan, as well as encouraging her to reach out to me with any questions or concerns.  .  Additional resources regarding healthy eating, recommendations for exercise, [employer] program, and brochures for support services were also included in the mailed packet.  A shorter version of the care plan was also routed/faxed/mailed to Kelly Hancock,Kelly Hancock, Kelly Hancock, the patient's PCP.  I will not be placing any follow-up appointments to the Survivorship Clinic for Kelly Hancock, but I am happy to see her at any time in the future for any survivorship concerns that may arise. Thank you for allowing me to participate in her care!  Charlestine Massed, NP [place] [phone]

## 2016-07-21 DIAGNOSIS — G4700 Insomnia, unspecified: Secondary | ICD-10-CM | POA: Diagnosis not present

## 2016-07-21 DIAGNOSIS — S8291XS Unspecified fracture of right lower leg, sequela: Secondary | ICD-10-CM | POA: Diagnosis not present

## 2016-07-21 DIAGNOSIS — Z0000 Encounter for general adult medical examination without abnormal findings: Secondary | ICD-10-CM | POA: Diagnosis not present

## 2016-07-21 DIAGNOSIS — Z8249 Family history of ischemic heart disease and other diseases of the circulatory system: Secondary | ICD-10-CM | POA: Diagnosis not present

## 2016-07-21 DIAGNOSIS — Z79899 Other long term (current) drug therapy: Secondary | ICD-10-CM | POA: Diagnosis not present

## 2016-07-21 DIAGNOSIS — Z6824 Body mass index (BMI) 24.0-24.9, adult: Secondary | ICD-10-CM | POA: Diagnosis not present

## 2016-07-21 DIAGNOSIS — Z853 Personal history of malignant neoplasm of breast: Secondary | ICD-10-CM | POA: Diagnosis not present

## 2016-07-21 DIAGNOSIS — M79661 Pain in right lower leg: Secondary | ICD-10-CM | POA: Diagnosis not present

## 2016-12-01 ENCOUNTER — Other Ambulatory Visit: Payer: Medicare HMO

## 2016-12-01 ENCOUNTER — Ambulatory Visit: Payer: Medicare HMO | Admitting: Hematology

## 2016-12-01 ENCOUNTER — Telehealth: Payer: Self-pay

## 2016-12-01 NOTE — Telephone Encounter (Signed)
Spoke with patient and she stated that she did call to rescheduled due to a family emergency. Requsted appointment to be in October. Per 9/19 message reply.
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
## 2016-12-13 ENCOUNTER — Ambulatory Visit: Payer: Medicare HMO | Admitting: Hematology

## 2016-12-13 ENCOUNTER — Other Ambulatory Visit: Payer: Medicare HMO

## 2016-12-14 ENCOUNTER — Ambulatory Visit (HOSPITAL_BASED_OUTPATIENT_CLINIC_OR_DEPARTMENT_OTHER): Payer: Medicare HMO | Admitting: Hematology

## 2016-12-14 ENCOUNTER — Telehealth: Payer: Self-pay | Admitting: Hematology

## 2016-12-14 ENCOUNTER — Encounter: Payer: Self-pay | Admitting: Hematology

## 2016-12-14 ENCOUNTER — Other Ambulatory Visit (HOSPITAL_BASED_OUTPATIENT_CLINIC_OR_DEPARTMENT_OTHER): Payer: Medicare HMO

## 2016-12-14 VITALS — BP 182/97 | HR 73 | Temp 97.9°F | Resp 18 | Ht 66.0 in | Wt 147.3 lb

## 2016-12-14 DIAGNOSIS — C50412 Malignant neoplasm of upper-outer quadrant of left female breast: Secondary | ICD-10-CM

## 2016-12-14 DIAGNOSIS — K219 Gastro-esophageal reflux disease without esophagitis: Secondary | ICD-10-CM

## 2016-12-14 DIAGNOSIS — I10 Essential (primary) hypertension: Secondary | ICD-10-CM

## 2016-12-14 DIAGNOSIS — M1990 Unspecified osteoarthritis, unspecified site: Secondary | ICD-10-CM

## 2016-12-14 DIAGNOSIS — Z170 Estrogen receptor positive status [ER+]: Secondary | ICD-10-CM

## 2016-12-14 LAB — COMPREHENSIVE METABOLIC PANEL
ALBUMIN: 4 g/dL (ref 3.5–5.0)
ALK PHOS: 72 U/L (ref 40–150)
ALT: 22 U/L (ref 0–55)
ANION GAP: 10 meq/L (ref 3–11)
AST: 21 U/L (ref 5–34)
BUN: 13.6 mg/dL (ref 7.0–26.0)
CALCIUM: 9.9 mg/dL (ref 8.4–10.4)
CHLORIDE: 107 meq/L (ref 98–109)
CO2: 25 mEq/L (ref 22–29)
Creatinine: 0.7 mg/dL (ref 0.6–1.1)
EGFR: 83 mL/min/{1.73_m2} — ABNORMAL LOW (ref >90)
Glucose: 105 mg/dl (ref 70–140)
POTASSIUM: 4.2 meq/L (ref 3.5–5.1)
Sodium: 141 mEq/L (ref 136–145)
Total Bilirubin: 0.36 mg/dL (ref 0.20–1.20)
Total Protein: 7.1 g/dL (ref 6.4–8.3)

## 2016-12-14 LAB — CBC WITH DIFFERENTIAL/PLATELET
BASO%: 0.4 % (ref 0.0–2.0)
Basophils Absolute: 0 10*3/uL (ref 0.0–0.1)
EOS%: 3.1 % (ref 0.0–7.0)
Eosinophils Absolute: 0.2 10*3/uL (ref 0.0–0.5)
HEMATOCRIT: 43.4 % (ref 34.8–46.6)
HEMOGLOBIN: 14.1 g/dL (ref 11.6–15.9)
LYMPH#: 1.6 10*3/uL (ref 0.9–3.3)
LYMPH%: 29.5 % (ref 14.0–49.7)
MCH: 27.5 pg (ref 25.1–34.0)
MCHC: 32.5 g/dL (ref 31.5–36.0)
MCV: 84.8 fL (ref 79.5–101.0)
MONO#: 0.6 10*3/uL (ref 0.1–0.9)
MONO%: 11.1 % (ref 0.0–14.0)
NEUT#: 3 10*3/uL (ref 1.5–6.5)
NEUT%: 55.9 % (ref 38.4–76.8)
PLATELETS: 237 10*3/uL (ref 145–400)
RBC: 5.12 10*6/uL (ref 3.70–5.45)
RDW: 13.3 % (ref 11.2–14.5)
WBC: 5.4 10*3/uL (ref 3.9–10.3)

## 2016-12-14 MED ORDER — LOSARTAN POTASSIUM 25 MG PO TABS: 25.0000 mg | ORAL_TABLET | Freq: Every day | ORAL | 1 refills | Status: DC

## 2016-12-14 NOTE — Telephone Encounter (Signed)
Gave avs and calendar for April 2019 °

## 2016-12-28 ENCOUNTER — Telehealth: Payer: Self-pay | Admitting: Hematology

## 2016-12-28 NOTE — Telephone Encounter (Signed)
12/27/16 prescription refill scanned in media °

## 2016-12-29 ENCOUNTER — Telehealth: Payer: Self-pay | Admitting: *Deleted

## 2016-12-29 NOTE — Telephone Encounter (Signed)
Called pt to discuss reaction she had from losartan per message from pharmacy.  She states she had hives on the forth day of taking losartan with itching.  She took benadryl & it too several days to subside.  She reports that [facility] but is taking some time off & won't be back until Nov.  She reports that she feels good & is monitoring her BP & wants to wait to see her PCP.  In the meantime if anything happens & BP is extremely elevated, instructed to see someone in the old PCP practice.  She also reports that she is scheduled for her mammo on Fri.  Message to Dr Burr Medico.

## 2016-12-31 ENCOUNTER — Ambulatory Visit
Admission: RE | Admit: 2016-12-31 | Discharge: 2016-12-31 | Disposition: A | Discharge status: Home / Self Care | Payer: Medicare HMO | Source: Ambulatory Visit | Attending: Nurse Practitioner | Admitting: Nurse Practitioner

## 2016-12-31 DIAGNOSIS — Z170 Estrogen receptor positive status [ER+]: Secondary | ICD-10-CM

## 2016-12-31 DIAGNOSIS — C50412 Malignant neoplasm of upper-outer quadrant of left female breast: Secondary | ICD-10-CM

## 2016-12-31 DIAGNOSIS — R928 Other abnormal and inconclusive findings on diagnostic imaging of breast: Secondary | ICD-10-CM | POA: Diagnosis not present
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
## 2017-06-16 ENCOUNTER — Telehealth: Payer: Self-pay | Admitting: Hematology

## 2017-06-16 ENCOUNTER — Inpatient Hospital Stay: Payer: Medicare HMO | Attending: Hematology | Admitting: Hematology

## 2017-06-16 ENCOUNTER — Encounter: Payer: Self-pay | Admitting: Hematology

## 2017-06-16 ENCOUNTER — Inpatient Hospital Stay: Payer: Medicare HMO

## 2017-06-16 VITALS — BP 174/94 | HR 77 | Temp 97.6°F | Resp 19 | Ht 66.0 in | Wt 153.3 lb

## 2017-06-16 DIAGNOSIS — Z170 Estrogen receptor positive status [ER+]: Secondary | ICD-10-CM | POA: Diagnosis not present

## 2017-06-16 DIAGNOSIS — R5383 Other fatigue: Secondary | ICD-10-CM

## 2017-06-16 DIAGNOSIS — C50411 Malignant neoplasm of upper-outer quadrant of right female breast: Secondary | ICD-10-CM | POA: Diagnosis not present

## 2017-06-16 DIAGNOSIS — C50412 Malignant neoplasm of upper-outer quadrant of left female breast: Secondary | ICD-10-CM

## 2017-06-16 DIAGNOSIS — I10 Essential (primary) hypertension: Secondary | ICD-10-CM | POA: Insufficient documentation

## 2017-06-16 LAB — CBC WITH DIFFERENTIAL/PLATELET
BASOS PCT: 1 %
Basophils Absolute: 0 10*3/uL (ref 0.0–0.1)
Eosinophils Absolute: 0.1 10*3/uL (ref 0.0–0.5)
Eosinophils Relative: 3 %
HCT: 41.9 % (ref 34.8–46.6)
HEMOGLOBIN: 13.6 g/dL (ref 11.6–15.9)
LYMPHS ABS: 1.2 10*3/uL (ref 0.9–3.3)
LYMPHS PCT: 25 %
MCH: 26.9 pg (ref 25.1–34.0)
MCHC: 32.5 g/dL (ref 31.5–36.0)
MCV: 82.8 fL (ref 79.5–101.0)
MONO ABS: 0.6 10*3/uL (ref 0.1–0.9)
MONOS PCT: 12 %
Neutro Abs: 3 10*3/uL (ref 1.5–6.5)
Neutrophils Relative %: 59 %
Platelets: 248 10*3/uL (ref 145–400)
RBC: 5.06 MIL/uL (ref 3.70–5.45)
RDW: 13 % (ref 11.2–14.5)
WBC: 4.9 10*3/uL (ref 3.9–10.3)

## 2017-06-16 LAB — COMPREHENSIVE METABOLIC PANEL
ALBUMIN: 3.8 g/dL (ref 3.5–5.0)
ALK PHOS: 76 U/L (ref 40–150)
ALT: 37 U/L (ref 0–55)
ANION GAP: 7 (ref 3–11)
AST: 23 U/L (ref 5–34)
BUN: 15 mg/dL (ref 7–26)
CALCIUM: 10.1 mg/dL (ref 8.4–10.4)
CHLORIDE: 108 mmol/L (ref 98–109)
CO2: 26 mmol/L (ref 22–29)
Creatinine, Ser: 0.75 mg/dL (ref 0.60–1.10)
GFR calc Af Amer: >60 mL/min (ref >60)
GFR calc non Af Amer: >60 mL/min (ref >60)
GLUCOSE: 103 mg/dL (ref 70–140)
Potassium: 5 mmol/L (ref 3.5–5.1)
SODIUM: 141 mmol/L (ref 136–145)
Total Bilirubin: 0.6 mg/dL (ref 0.2–1.2)
Total Protein: 6.9 g/dL (ref 6.4–8.3)

## 2017-06-16 MED ORDER — AMLODIPINE BESYLATE 5 MG PO TABS: 5.0000 mg | ORAL_TABLET | Freq: Every day | ORAL | 2 refills | Status: DC

## 2017-06-16 NOTE — Telephone Encounter (Signed)
Scheduled appt per 4/4 los - Gave patient AVS and calender per los.  

## 2017-08-01 DIAGNOSIS — Z853 Personal history of malignant neoplasm of breast: Secondary | ICD-10-CM | POA: Diagnosis not present

## 2017-08-01 DIAGNOSIS — Z8249 Family history of ischemic heart disease and other diseases of the circulatory system: Secondary | ICD-10-CM | POA: Diagnosis not present

## 2017-08-01 DIAGNOSIS — I10 Essential (primary) hypertension: Secondary | ICD-10-CM | POA: Diagnosis not present

## 2017-08-01 DIAGNOSIS — Z885 Allergy status to narcotic agent status: Secondary | ICD-10-CM | POA: Diagnosis not present

## 2017-08-01 DIAGNOSIS — R269 Unspecified abnormalities of gait and mobility: Secondary | ICD-10-CM | POA: Diagnosis not present

## 2017-08-01 DIAGNOSIS — Z823 Family history of stroke: Secondary | ICD-10-CM | POA: Diagnosis not present

## 2017-08-01 DIAGNOSIS — Z87892 Personal history of anaphylaxis: Secondary | ICD-10-CM | POA: Diagnosis not present

## 2017-08-01 DIAGNOSIS — Z96649 Presence of unspecified artificial hip joint: Secondary | ICD-10-CM | POA: Diagnosis not present

## 2017-09-14 IMAGING — MG MM DIGITAL SCREENING BILAT
4 series · 4 of 4 positions shown · non-contrast
Comparison: Previous exam(s).

CLINICAL DATA: Screening.

EXAM:
DIGITAL SCREENING BILATERAL MAMMOGRAM WITH CAD

[R CC]
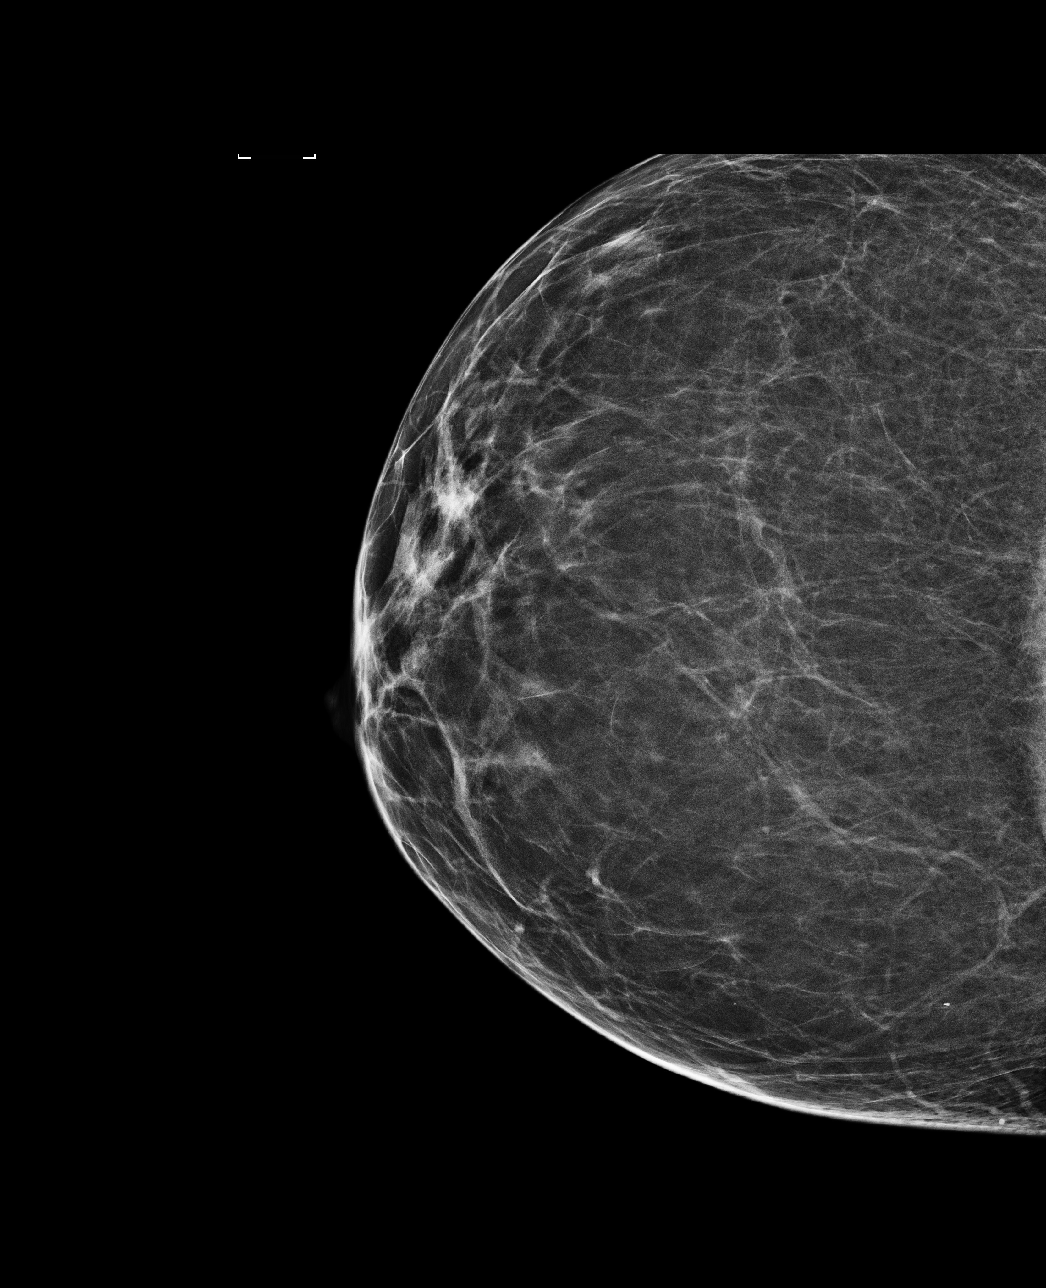

[L CC]
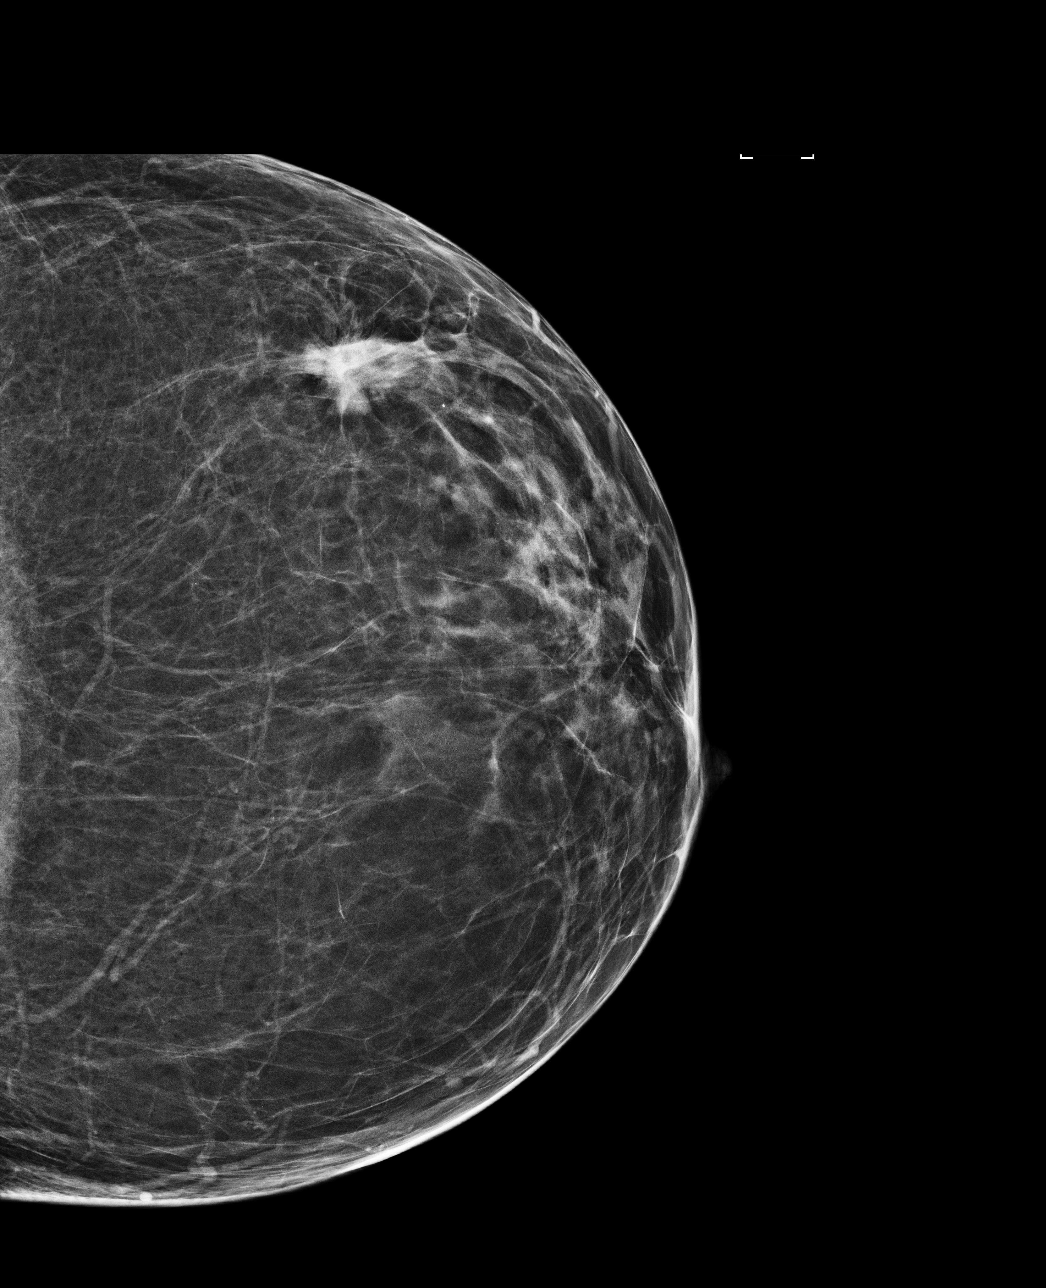

[R MLO]
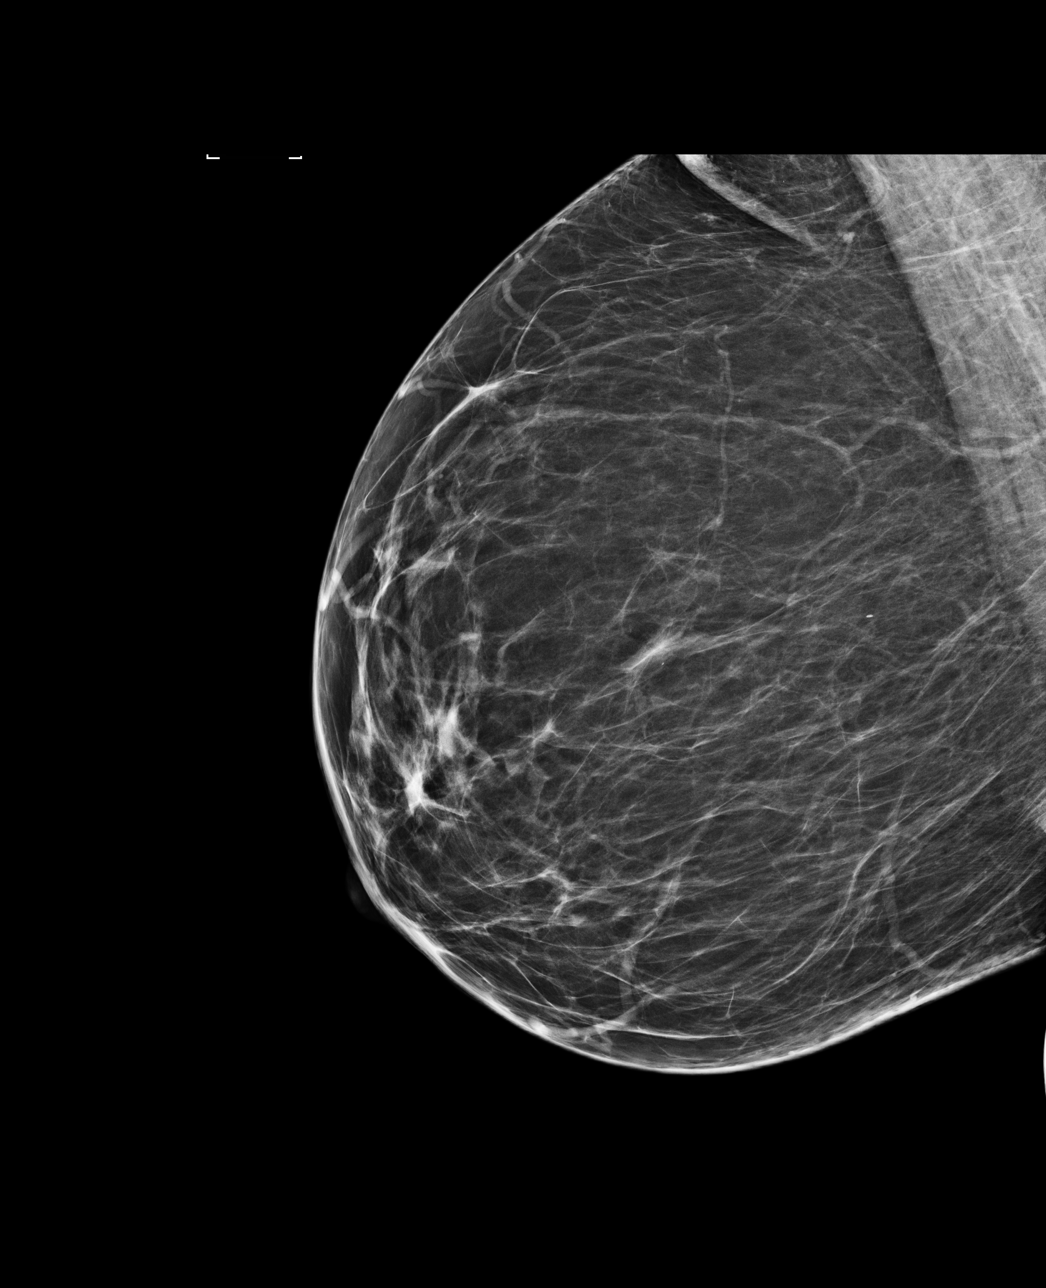

[L MLO]
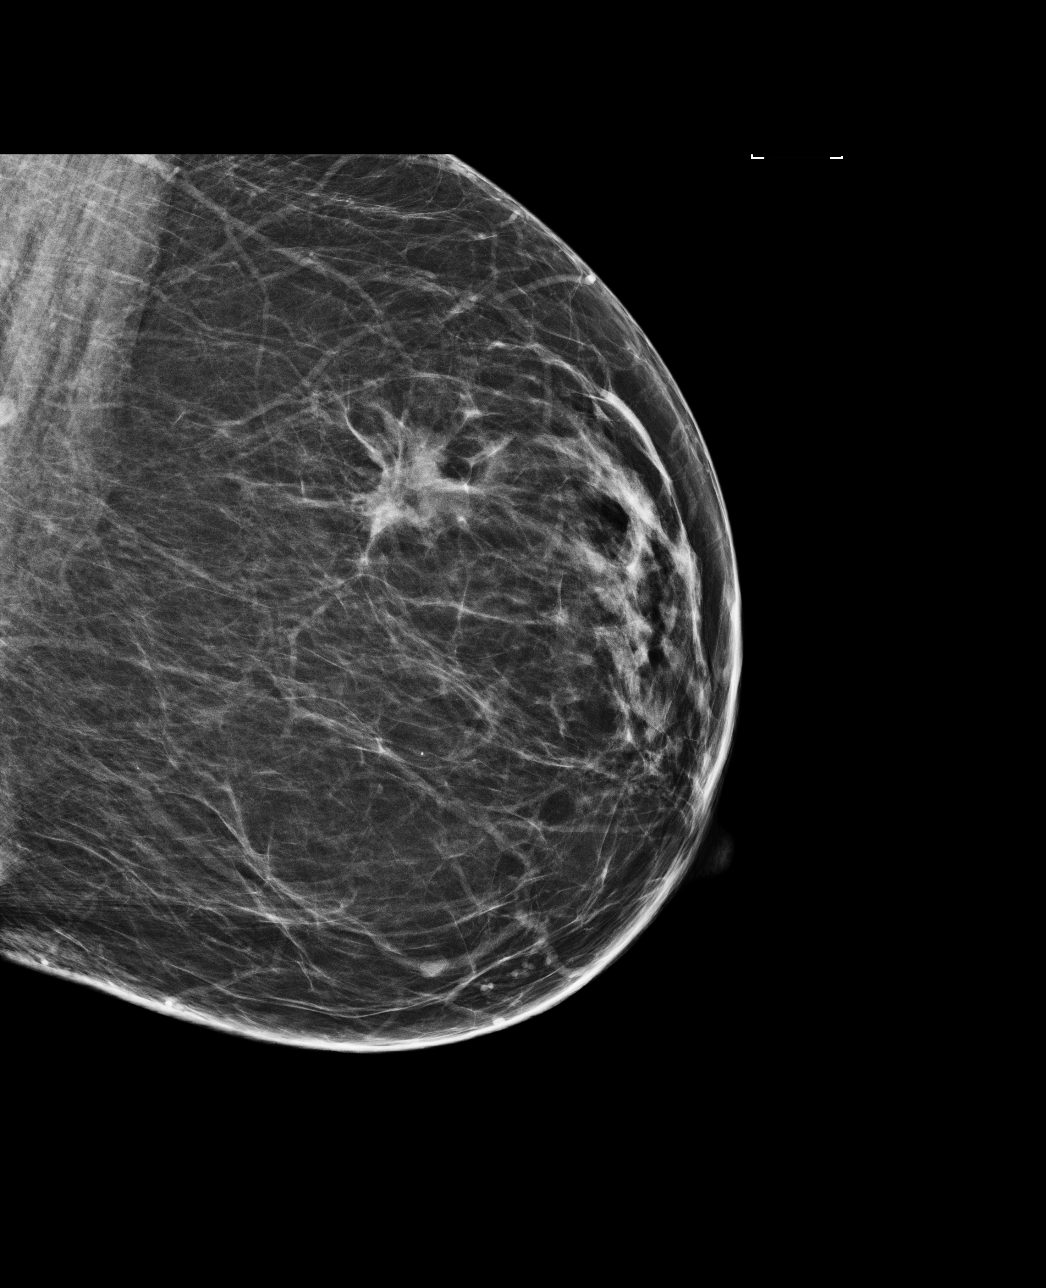

[4 of 4 positions shown; findings below may reference images not displayed]

ACR Breast Density Category b: There are scattered areas of
fibroglandular density.
FINDINGS: In the left breast, a possible asymmetry warrants further
evaluation. In the right breast, no findings suspicious for
malignancy. Images were processed with CAD.
IMPRESSION: Further evaluation is suggested for possible asymmetry in the left
breast.

RECOMMENDATION:
Diagnostic mammogram and possibly ultrasound of the left breast.
(Code:[id number])

The patient will be contacted regarding the findings, and additional
imaging will be scheduled.

BI-RADS CATEGORY  0: Incomplete. Need additional imaging evaluation
and/or prior mammograms for comparison.

## 2017-09-16 ENCOUNTER — Other Ambulatory Visit: Payer: Self-pay | Admitting: Hematology

## 2017-09-28 DIAGNOSIS — R69 Illness, unspecified: Secondary | ICD-10-CM | POA: Diagnosis not present

## 2017-09-28 DIAGNOSIS — M169 Osteoarthritis of hip, unspecified: Secondary | ICD-10-CM | POA: Diagnosis not present

## 2017-09-28 DIAGNOSIS — I10 Essential (primary) hypertension: Secondary | ICD-10-CM | POA: Diagnosis not present

## 2017-09-28 DIAGNOSIS — Z0000 Encounter for general adult medical examination without abnormal findings: Secondary | ICD-10-CM | POA: Diagnosis not present

## 2017-10-05 DIAGNOSIS — R69 Illness, unspecified: Secondary | ICD-10-CM | POA: Diagnosis not present

## 2017-10-05 DIAGNOSIS — E079 Disorder of thyroid, unspecified: Secondary | ICD-10-CM | POA: Diagnosis not present

## 2017-10-05 DIAGNOSIS — I10 Essential (primary) hypertension: Secondary | ICD-10-CM | POA: Diagnosis not present

## 2017-10-12 DIAGNOSIS — I10 Essential (primary) hypertension: Secondary | ICD-10-CM | POA: Diagnosis not present

## 2017-10-12 DIAGNOSIS — R69 Illness, unspecified: Secondary | ICD-10-CM | POA: Diagnosis not present

## 2017-10-12 DIAGNOSIS — E079 Disorder of thyroid, unspecified: Secondary | ICD-10-CM | POA: Diagnosis not present
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
## 2017-10-19 ENCOUNTER — Inpatient Hospital Stay: Payer: Medicare HMO

## 2017-10-19 ENCOUNTER — Encounter: Payer: Self-pay | Admitting: Hematology

## 2017-10-19 ENCOUNTER — Inpatient Hospital Stay: Payer: Medicare HMO | Attending: Hematology | Admitting: Hematology

## 2017-10-19 ENCOUNTER — Telehealth: Payer: Self-pay

## 2017-10-19 VITALS — BP 169/89 | HR 75 | Temp 97.9°F | Resp 17 | Ht 66.0 in | Wt 155.8 lb

## 2017-10-19 DIAGNOSIS — C50412 Malignant neoplasm of upper-outer quadrant of left female breast: Secondary | ICD-10-CM

## 2017-10-19 DIAGNOSIS — Z170 Estrogen receptor positive status [ER+]: Secondary | ICD-10-CM | POA: Diagnosis not present

## 2017-10-19 DIAGNOSIS — I10 Essential (primary) hypertension: Secondary | ICD-10-CM | POA: Insufficient documentation

## 2017-10-19 DIAGNOSIS — Z79899 Other long term (current) drug therapy: Secondary | ICD-10-CM | POA: Diagnosis not present

## 2017-10-19 DIAGNOSIS — G4700 Insomnia, unspecified: Secondary | ICD-10-CM | POA: Insufficient documentation

## 2017-10-19 DIAGNOSIS — R5383 Other fatigue: Secondary | ICD-10-CM

## 2017-10-19 DIAGNOSIS — Z1231 Encounter for screening mammogram for malignant neoplasm of breast: Secondary | ICD-10-CM

## 2017-10-19 DIAGNOSIS — E2839 Other primary ovarian failure: Secondary | ICD-10-CM

## 2017-10-19 LAB — CBC WITH DIFFERENTIAL/PLATELET
BASOS PCT: 0 %
Basophils Absolute: 0 10*3/uL (ref 0.0–0.1)
EOS ABS: 0.1 10*3/uL (ref 0.0–0.5)
EOS PCT: 2 %
HCT: 42.7 % (ref 34.8–46.6)
HEMOGLOBIN: 13.9 g/dL (ref 11.6–15.9)
LYMPHS ABS: 1.4 10*3/uL (ref 0.9–3.3)
Lymphocytes Relative: 25 %
MCH: 27.3 pg (ref 25.1–34.0)
MCHC: 32.6 g/dL (ref 31.5–36.0)
MCV: 83.9 fL (ref 79.5–101.0)
MONO ABS: 0.7 10*3/uL (ref 0.1–0.9)
MONOS PCT: 11 %
Neutro Abs: 3.5 10*3/uL (ref 1.5–6.5)
Neutrophils Relative %: 62 %
Platelets: 245 10*3/uL (ref 145–400)
RBC: 5.09 MIL/uL (ref 3.70–5.45)
RDW: 13.2 % (ref 11.2–14.5)
WBC: 5.8 10*3/uL (ref 3.9–10.3)

## 2017-10-19 LAB — COMPREHENSIVE METABOLIC PANEL
ALK PHOS: 77 U/L (ref 38–126)
ALT: 37 U/L (ref 0–44)
AST: 24 U/L (ref 15–41)
Albumin: 4 g/dL (ref 3.5–5.0)
Anion gap: 11 (ref 5–15)
BUN: 14 mg/dL (ref 8–23)
CALCIUM: 9.5 mg/dL (ref 8.9–10.3)
CHLORIDE: 106 mmol/L (ref 98–111)
CO2: 22 mmol/L (ref 22–32)
CREATININE: 0.75 mg/dL (ref 0.44–1.00)
GFR calc non Af Amer: >60 mL/min (ref >60)
GLUCOSE: 108 mg/dL — AB (ref 70–99)
Potassium: 3.8 mmol/L (ref 3.5–5.1)
SODIUM: 139 mmol/L (ref 135–145)
Total Bilirubin: 0.5 mg/dL (ref 0.3–1.2)
Total Protein: 7.2 g/dL (ref 6.5–8.1)

## 2017-10-19 LAB — TSH: TSH: 2.377 u[IU]/mL (ref 0.308–3.960)

## 2017-10-19 NOTE — Telephone Encounter (Signed)
Printed avs and calender of upcoming appointment. Per 8/7 los 

## 2017-10-19 NOTE — Addendum Note (Signed)
Addended by: Jesse Fall on: 10/19/2017 12:00 PM   Modules accepted: Orders

## 2017-10-26 DIAGNOSIS — R69 Illness, unspecified: Secondary | ICD-10-CM | POA: Diagnosis not present

## 2017-10-26 DIAGNOSIS — R400 Somnolence: Secondary | ICD-10-CM | POA: Diagnosis not present

## 2017-10-26 DIAGNOSIS — I10 Essential (primary) hypertension: Secondary | ICD-10-CM | POA: Diagnosis not present

## 2017-10-26 DIAGNOSIS — C50919 Malignant neoplasm of unspecified site of unspecified female breast: Secondary | ICD-10-CM | POA: Diagnosis not present

## 2017-11-03 ENCOUNTER — Other Ambulatory Visit: Payer: Self-pay | Admitting: Hematology

## 2017-11-03 DIAGNOSIS — Z8481 Family history of carrier of genetic disease: Secondary | ICD-10-CM

## 2017-12-04 DIAGNOSIS — G4730 Sleep apnea, unspecified: Secondary | ICD-10-CM | POA: Diagnosis not present

## 2017-12-21 DIAGNOSIS — I10 Essential (primary) hypertension: Secondary | ICD-10-CM | POA: Diagnosis not present

## 2017-12-21 DIAGNOSIS — G4733 Obstructive sleep apnea (adult) (pediatric): Secondary | ICD-10-CM | POA: Diagnosis not present

## 2017-12-21 DIAGNOSIS — R69 Illness, unspecified: Secondary | ICD-10-CM | POA: Diagnosis not present

## 2018-01-04 DIAGNOSIS — R69 Illness, unspecified: Secondary | ICD-10-CM | POA: Diagnosis not present

## 2018-01-04 DIAGNOSIS — G4733 Obstructive sleep apnea (adult) (pediatric): Secondary | ICD-10-CM | POA: Diagnosis not present

## 2018-01-04 DIAGNOSIS — I10 Essential (primary) hypertension: Secondary | ICD-10-CM | POA: Diagnosis not present

## 2018-01-18 DIAGNOSIS — G4733 Obstructive sleep apnea (adult) (pediatric): Secondary | ICD-10-CM | POA: Diagnosis not present

## 2018-01-18 DIAGNOSIS — R69 Illness, unspecified: Secondary | ICD-10-CM | POA: Diagnosis not present

## 2018-01-18 DIAGNOSIS — C50919 Malignant neoplasm of unspecified site of unspecified female breast: Secondary | ICD-10-CM | POA: Diagnosis not present

## 2018-02-01 DIAGNOSIS — R69 Illness, unspecified: Secondary | ICD-10-CM | POA: Diagnosis not present

## 2018-02-01 DIAGNOSIS — H6123 Impacted cerumen, bilateral: Secondary | ICD-10-CM | POA: Diagnosis not present

## 2018-02-01 DIAGNOSIS — H9193 Unspecified hearing loss, bilateral: Secondary | ICD-10-CM | POA: Diagnosis not present

## 2018-02-01 DIAGNOSIS — E079 Disorder of thyroid, unspecified: Secondary | ICD-10-CM | POA: Diagnosis not present

## 2018-04-17 NOTE — Progress Notes (Signed)
Kelly Hancock   [phone] Fax:([phone]) [phone]   Clinic Follow up Note   Patient Care Team: Hayden Rasmussen, MD as PCP - General (Family Medicine) Excell Seltzer, MD as Consulting Physician (General Surgery) Eppie Gibson, MD as Attending Physician (Radiation Oncology) Gardenia Phlegm, NP as Nurse Practitioner (Hematology and Oncology) Truitt Merle, MD as Consulting Physician (Hematology) Hayden Rasmussen, MD as Referring Physician (Family Medicine)  Date of Service:  04/19/2018  CHIEF COMPLAINT: Follow up left breast cancer  SUMMARY OF ONCOLOGIC HISTORY: Oncology History   Carcinoma of upper-outer quadrant of left female breast [facility])   Staging form: Breast, AJCC 7th Edition   - Clinical stage from 08/20/2015: Stage IA (T1c, N0, M0) - Signed by Truitt Merle, MD on 09/13/2015   - Pathologic stage from 11/12/2015: Stage IIA (T2, N0, cM0) - Signed by Truitt Merle, MD on 12/08/2015       Carcinoma of upper-outer quadrant of left female breast ([place])   08/20/2015 Initial Diagnosis    Carcinoma of upper-outer quadrant of left female breast ([place])    08/20/2015 Mammogram    Diagnostic and ultrasound of left breast showed a 2.0 x 1.4 x 1.9 cm mass in the left breast at 2:00 position, 7 cm from nipple. Axilla was negative.    08/20/2015 Initial Biopsy    Left breast mass biopsy showed invasive ductal carcinoma, grade 2.    08/20/2015 Receptors her2    ER 100% strongly positive, PR 50% strongly positive, Ki-67 12%, HER-2 negative    11/12/2015 Surgery    Left breast lumpectomy and sentinel lymph node biopsy    11/12/2015 Pathology Results    Left breast lumpectomy showed invasive ductal carcinoma, 2.5 cm, margins were negative. 2 sentinel lymph nodes were negative.    11/12/2015 Oncotype testing     recurrence score 10, low risk, which predicts 10 year risk of distant recurrence 7% with tamoxifen.     11/26/2015 Surgery    Bilateral breast reconstruction with breast  reduction      CURRENT THERAPY:  Surveillance   INTERVAL HISTORY:  Kelly Hancock is here for a follow up of left breast cancer. She was last seen by me 6 months ago. She presents to the clinic today by herself. She notes she is doing well. She notes After her breast reconstruction she had a breast mammogram that ruined her stitches and caused her significant breast pain. She is reluctant to have another breast mammogram. She notes she has metal in her hip from total right hip replacement in 1998 and is not sure she can have MRI.  She takes 5000Units VitD, 33m of Zinc and takes Multivitamin. She plans to restart oral calcium.  She has inflammation of her right ankle and left shoulder. She notes [place]. She does not she completed PT after breast surgery.   She notes significant stress with her daughter's comobities. Her daughter lives with her and helps her.     REVIEW OF SYSTEMS:   Constitutional: Denies fevers, chills or abnormal weight loss (+) Lower appetite (+) trouble sleeping  Eyes: Denies blurriness of vision Ears, nose, mouth, throat, and face: Denies mucositis or sore throat Respiratory: Denies cough, dyspnea or wheezes Cardiovascular: Denies palpitation, chest discomfort or lower extremity swelling Gastrointestinal:  Denies nausea, heartburn or change in bowel habits Skin: Denies abnormal skin rashes MSK: (+) right ankle swelling Lymphatics: Denies new lymphadenopathy or easy bruising  Neurological:Denies numbness, tingling or new weaknesses Behavioral/Psych: Mood is stable, no new changes (+) Stressed  BREAST: (+) B/l breast pain and inflammation  All other systems were reviewed with the patient and are negative.  MEDICAL HISTORY:  Past Medical History:  Diagnosis Date  . History of breast cancer 09/2015  . Osteoarthritis    right hip  . PONV (postoperative nausea and vomiting)     SURGICAL  HISTORY: Past Surgical History:  Procedure Laterality Date  . APPENDECTOMY    . BREAST LUMPECTOMY Left 11/12/2015  . BREAST LUMPECTOMY WITH RADIOACTIVE SEED AND SENTINEL LYMPH NODE BIOPSY Left 11/12/2015   Procedure: BREAST LUMPECTOMY WITH RADIOACTIVE SEED AND SENTINEL LYMPH NODE BIOPSY;  Surgeon: Excell Seltzer, MD;  Location: [place];  Service: General;  Laterality: Left;  . BREAST REDUCTION SURGERY Bilateral 11/26/2015   Procedure: MAMMARY REDUCTION  (BREAST) FOR ASYMMETRY;  Surgeon: Wallace Going, DO;  Location: [place];  Service: Plastics;  Laterality: Bilateral;  . LIPOSUCTION Bilateral 11/26/2015   Procedure: LIPOSUCTION;  Surgeon: Wallace Going, DO;  Location: [place];  Service: Plastics;  Laterality: Bilateral;  . REDUCTION MAMMAPLASTY Bilateral 10/2015  . TOTAL HIP ARTHROPLASTY Right     I have reviewed the social history and family history with the patient and they are unchanged from previous note.  ALLERGIES:  is allergic to demerol; meperidine; adhesive [tape]; and other.  MEDICATIONS:  Current Outpatient Medications  Medication Sig Dispense Refill  . cholecalciferol (VITAMIN D) 1000 units tablet Take 5,000 Units by mouth 3 (three) times a week.    . Multiple Vitamins-Minerals (MULTIVITAMIN WITH MINERALS) tablet Take 1 tablet by mouth daily.    Marland Kitchen zinc gluconate 50 MG tablet Take 50 mg by mouth daily.    Marland Kitchen b complex vitamins tablet Take 1 tablet by mouth daily.    . Calcium-Magnesium-Zinc 333-133-5 MG TABS Take 1 tablet by mouth daily.    Marland Kitchen omega-3 acid ethyl esters (LOVAZA) 1 g capsule Take 1 capsule by mouth daily. TAKES 5000 UNITS THREE X/WK & INCREASES TO FIVE X/WK IN WINTER     No current facility-administered medications for this visit.     PHYSICAL EXAMINATION: ECOG PERFORMANCE STATUS: 1 - Symptomatic but completely ambulatory  Vitals:   04/19/18 1010 04/19/18 1014  BP: (!) 166/95 (!) 169/86   Pulse: 79   Resp: 17   Temp: 97.6 F (36.4 C)   SpO2: 99%    Filed Weights   04/19/18 1010  Weight: 157 lb 1.6 oz (71.3 kg)    GENERAL:alert, no distress and comfortable SKIN: skin color, texture, turgor are normal, no rashes or significant lesions EYES: normal, Conjunctiva are pink and non-injected, sclera clear OROPHARYNX:no exudate, no erythema and lips, buccal mucosa, and tongue normal  NECK: supple, thyroid normal size, non-tender, without nodularity LYMPH:  no palpable lymphadenopathy in the cervical, axillary or inguinal LUNGS: clear to auscultation and percussion with normal breathing effort HEART: regular rate & rhythm and no murmurs and no lower extremity edema ABDOMEN:abdomen soft, non-tender and normal bowel sounds Musculoskeletal:no cyanosis of digits and no clubbing  NEURO: alert & oriented x 3 with fluent speech, no focal motor/sensory deficits BREAST: S/p left breast lumpectomy and right breast reduction: Surgical incisions elongated but healed well. No palpable mass or adenopathy   LABORATORY DATA:  I have reviewed the data as listed CBC Latest Ref Rng & Units 04/19/2018 10/19/2017 06/16/2017  WBC 4.0 - 10.5 K/uL 7.3 5.8 4.9  Hemoglobin 12.0 - 15.0 g/dL 14.8 13.9 13.6  Hematocrit 36.0 - 46.0 % 45.4 42.7 41.9  Platelets 150 - 400 K/uL 282 245 248     CMP Latest Ref Rng & Units 04/19/2018 10/19/2017 06/16/2017  Glucose 70 - 99 mg/dL 104(H) 108(H) 103  BUN 8 - 23 mg/dL 15 14 15   Creatinine 0.44 - 1.00 mg/dL 0.77 0.75 0.75  Sodium 135 - 145 mmol/L 140 139 141  Potassium 3.5 - 5.1 mmol/L 4.0 3.8 5.0  Chloride 98 - 111 mmol/L 106 106 108  CO2 22 - 32 mmol/L 24 22 26   Calcium 8.9 - 10.3 mg/dL 9.8 9.5 10.1  Total Protein 6.5 - 8.1 g/dL 7.3 7.2 6.9  Total Bilirubin 0.3 - 1.2 mg/dL 0.4 0.5 0.6  Alkaline Phos 38 - 126 U/L 87 77 76  AST 15 - 41 U/L 24 24 23   ALT 0 - 44 U/L 42 37 37      RADIOGRAPHIC STUDIES: I have personally reviewed the radiological images as listed  and agreed with the findings in the report. No results found.   ASSESSMENT & PLAN:  Kelly Hancock is a 73 y.o. female with   1. Carcinoma of upper-outer quadrant of left female breast, invasive ductal carcinoma, pT2N0M0, stage IIA, grade 2, ER+/PR+/HER2- -She was diagnosed in 08/2015. She is s/p left breast lumpectomy and b/l reconstruction.  -the Oncotype Dx result was reviewed with her in detail. She has low risk based on the recurrence score, which predicts 10 year distant recurrence after 5 years of tamoxifen 7%. Adjuvant chemo was not recommended due to low risk disease -She declined adjuvant radiation and AI/Tamoxifen due to concerns about the potential side effects from medications. -Since her 2018 mammogram altered the incisions of her breast reconstruction and has left her with b/l breast pain, she is very reluctant to have more mammograms. She does have metal in right hip, post 1998 total hip replacement, but is still eligible for MRI. Given she is past due, I recommend screening breast MRI in the next 2 weeks.   -She is clinically doing well. Lab reviewed, her CBC and CMP mostly within normal limits with BG at 104. Her physical exam was unremarkable. There is no clinical concern for recurrence. -she would like to have PT again for left breat and arm, will refer  -F/u in 6 months   2. HTN -Followed by primary care physician, Dr. Darron Doom -She was on Losartan previously but stopped after having a reaction.   -She was on Amlodipine but due to leg swelling and joint pain she was switched from amlodipine to lisinopril.  -Stable, BP at 169/86 today (04/19/18)   4. Bone health -She has previously declined DEXA scans.  -Continue vitamin D and calcium.   5. Social Support, insomnia -She remains stressed with having to take care of her daughter and husband. I encouraged her to also take care of herself.  -She is not eating as well and she is not sleeping efficiently.   Plan -Lab  and f/u in 6 months  -screening breast MRI in the next 2 weeks  -PT referral    No problem-specific Assessment & Plan notes found for this encounter.   Orders Placed This Encounter  Procedures  . MR BREAST BILATERAL W [place] CAD    PT declines mammogram due to breast pain    Standing Status:   Future    Standing Expiration Date:   06/18/2019    Order Specific Question:  If indicated for the ordered procedure, I authorize the administration of contrast media per Radiology protocol    Answer:   Yes    Order Specific Question:   What is the patient's sedation requirement?    Answer:   No Sedation    Order Specific Question:   Does the patient have a pacemaker or implanted devices?    Answer:   No    Order Specific Question:   Radiology Contrast Protocol - do NOT remove file path    Answer:   [email or web address]    Order Specific Question:   Preferred imaging location?    Answer:   [facility] (table limit-550lbs)  . Ambulatory referral to Physical Therapy    Referral Priority:   Routine    Referral Type:   Physical Medicine    Referral Reason:   Specialty Services Required    Requested Specialty:   Physical Therapy    Number of Visits Requested:   1   All questions were answered. The patient knows to call the clinic with any problems, questions or concerns. No barriers to learning was detected. I spent 15 minutes counseling the patient face to face. The total time spent in the appointment was 20 minutes and more than 50% was on counseling and review of test results     Truitt Merle, MD 04/19/2018   I, Joslyn Devon, am acting as scribe for Truitt Merle, MD.   I have reviewed the above documentation for accuracy and completeness, and I agree with the above.

## 2018-04-19 ENCOUNTER — Inpatient Hospital Stay: Payer: Medicare Other | Admitting: Hematology

## 2018-04-19 ENCOUNTER — Inpatient Hospital Stay: Payer: Medicare Other | Attending: Hematology

## 2018-04-19 ENCOUNTER — Telehealth: Payer: Self-pay | Admitting: Hematology

## 2018-04-19 VITALS — BP 169/86 | HR 79 | Temp 97.6°F | Resp 17 | Ht 66.0 in | Wt 157.1 lb

## 2018-04-19 DIAGNOSIS — I10 Essential (primary) hypertension: Secondary | ICD-10-CM | POA: Insufficient documentation

## 2018-04-19 DIAGNOSIS — C50412 Malignant neoplasm of upper-outer quadrant of left female breast: Secondary | ICD-10-CM | POA: Diagnosis present

## 2018-04-19 DIAGNOSIS — Z170 Estrogen receptor positive status [ER+]: Secondary | ICD-10-CM | POA: Insufficient documentation

## 2018-04-19 DIAGNOSIS — Z96641 Presence of right artificial hip joint: Secondary | ICD-10-CM | POA: Insufficient documentation

## 2018-04-19 DIAGNOSIS — G4700 Insomnia, unspecified: Secondary | ICD-10-CM | POA: Diagnosis not present

## 2018-04-19 LAB — CBC WITH DIFFERENTIAL/PLATELET
Abs Immature Granulocytes: 0.03 10*3/uL (ref 0.00–0.07)
BASOS PCT: 0 %
Basophils Absolute: 0 10*3/uL (ref 0.0–0.1)
EOS ABS: 0.1 10*3/uL (ref 0.0–0.5)
EOS PCT: 1 %
HEMATOCRIT: 45.4 % (ref 36.0–46.0)
Hemoglobin: 14.8 g/dL (ref 12.0–15.0)
Immature Granulocytes: 0 %
LYMPHS ABS: 1.4 10*3/uL (ref 0.7–4.0)
Lymphocytes Relative: 19 %
MCH: 27.3 pg (ref 26.0–34.0)
MCHC: 32.6 g/dL (ref 30.0–36.0)
MCV: 83.6 fL (ref 80.0–100.0)
Monocytes Absolute: 0.6 10*3/uL (ref 0.1–1.0)
Monocytes Relative: 8 %
Neutro Abs: 5.2 10*3/uL (ref 1.7–7.7)
Neutrophils Relative %: 72 %
PLATELETS: 282 10*3/uL (ref 150–400)
RBC: 5.43 MIL/uL — ABNORMAL HIGH (ref 3.87–5.11)
RDW: 13.1 % (ref 11.5–15.5)
WBC: 7.3 10*3/uL (ref 4.0–10.5)
nRBC: 0 % (ref 0.0–0.2)

## 2018-04-19 LAB — COMPREHENSIVE METABOLIC PANEL
ALK PHOS: 87 U/L (ref 38–126)
ALT: 42 U/L (ref 0–44)
AST: 24 U/L (ref 15–41)
Albumin: 4.1 g/dL (ref 3.5–5.0)
Anion gap: 10 (ref 5–15)
BILIRUBIN TOTAL: 0.4 mg/dL (ref 0.3–1.2)
BUN: 15 mg/dL (ref 8–23)
CALCIUM: 9.8 mg/dL (ref 8.9–10.3)
CO2: 24 mmol/L (ref 22–32)
CREATININE: 0.77 mg/dL (ref 0.44–1.00)
Chloride: 106 mmol/L (ref 98–111)
GFR calc Af Amer: >60 mL/min (ref >60)
Glucose, Bld: 104 mg/dL — ABNORMAL HIGH (ref 70–99)
Potassium: 4 mmol/L (ref 3.5–5.1)
SODIUM: 140 mmol/L (ref 135–145)
TOTAL PROTEIN: 7.3 g/dL (ref 6.5–8.1)

## 2018-04-19 NOTE — Telephone Encounter (Signed)
Scheduled appt per 02/05 los. ° °Printed calendar and avs. °

## 2018-04-20 ENCOUNTER — Encounter: Payer: Self-pay | Admitting: Hematology

## 2018-04-21 ENCOUNTER — Ambulatory Visit: Payer: Medicare Other | Admitting: Physical Therapy

## 2018-04-27 ENCOUNTER — Ambulatory Visit: Payer: Medicare Other | Attending: Hematology | Admitting: Physical Therapy

## 2018-04-27 ENCOUNTER — Other Ambulatory Visit: Payer: Self-pay

## 2018-04-27 ENCOUNTER — Encounter: Payer: Self-pay | Admitting: Physical Therapy

## 2018-04-27 DIAGNOSIS — M6281 Muscle weakness (generalized): Secondary | ICD-10-CM

## 2018-04-27 DIAGNOSIS — M25612 Stiffness of left shoulder, not elsewhere classified: Secondary | ICD-10-CM

## 2018-04-27 DIAGNOSIS — M25512 Pain in left shoulder: Secondary | ICD-10-CM | POA: Diagnosis present

## 2018-04-27 DIAGNOSIS — R293 Abnormal posture: Secondary | ICD-10-CM

## 2018-04-27 DIAGNOSIS — G8929 Other chronic pain: Secondary | ICD-10-CM
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
## 2018-04-27 NOTE — Patient Instructions (Signed)
    DO THESE EXERCISES IN SITTING, REACHING UP THROUGH CROWN OF HEAD TO BE TALL IN THE TRUNK      Over Head Pull: Narrow and Wide Grip   Cancer Rehab [phone]   On back, knees bent, feet flat, band across thighs, elbows straight but relaxed. Pull hands apart (start). Keeping elbows straight, bring arms up and over head, hands toward floor. Keep pull steady on band. Hold momentarily. Return slowly, keeping pull steady, back to start. Then do same with a wider grip on the band (past shoulder width) Repeat _5-10__ times. Band color __yellow____   Side Pull: Double Arm   On back, knees bent, feet flat. Arms perpendicular to body, shoulder level, elbows straight but relaxed. Pull arms out to sides, elbows straight. Resistance band comes across collarbones, hands toward floor. Hold momentarily. Slowly return to starting position. Repeat _5-10__ times. Band color _yellow____   Sword   On back, knees bent, feet flat, left hand on left hip, right hand above left. Pull right arm DIAGONALLY (hip to shoulder) across chest. Bring right arm along head toward floor. Hold momentarily. Slowly return to starting position. Repeat _5-10__ times. Do with left arm. Band color _yellow_____   Shoulder Rotation: Double Arm   On back, knees bent, feet flat, elbows tucked at sides, bent 90, hands palms up. Pull hands apart and down toward floor, keeping elbows near sides. Hold momentarily. Slowly return to starting position. Repeat _5-10__ times. Band color __yellow____

## 2018-05-03 ENCOUNTER — Ambulatory Visit: Payer: Medicare Other | Admitting: Physical Therapy

## 2018-05-03 ENCOUNTER — Ambulatory Visit: Payer: Self-pay | Admitting: Physical Therapy

## 2018-05-03 ENCOUNTER — Encounter: Payer: Self-pay | Admitting: Physical Therapy

## 2018-05-03 DIAGNOSIS — M25512 Pain in left shoulder: Secondary | ICD-10-CM

## 2018-05-03 DIAGNOSIS — M25612 Stiffness of left shoulder, not elsewhere classified: Secondary | ICD-10-CM | POA: Diagnosis not present

## 2018-05-03 DIAGNOSIS — G8929 Other chronic pain: Secondary | ICD-10-CM

## 2018-05-03 DIAGNOSIS — M6281 Muscle weakness (generalized): Secondary | ICD-10-CM

## 2018-05-03 DIAGNOSIS — R293 Abnormal posture: Secondary | ICD-10-CM

## 2018-05-03 NOTE — Therapy (Signed)
[place], [place], [address] Phone: [phone]   Fax:  [phone]  Physical Therapy Treatment  Patient Details  Name: Kelly Hancock MRN: [id number] Date of Birth: Jun 30, 1945 Referring Provider (PT): Dr. Burr Medico    Encounter Date: 05/03/2018  PT End of Session - 05/03/18 1604    Visit Number  2    Number of Visits  9    Date for PT Re-Evaluation  05/26/18    PT Start Time  1245    PT Stop Time  1430    PT Time Calculation (min)  45 min    Activity Tolerance  Patient tolerated treatment well    Behavior During Therapy  [facility] for tasks assessed/performed       Past Medical History:  Diagnosis Date  . History of breast cancer 09/2015  . Osteoarthritis    right hip  . PONV (postoperative nausea and vomiting)     Past Surgical History:  Procedure Laterality Date  . APPENDECTOMY    . BREAST LUMPECTOMY Left 11/12/2015  . BREAST LUMPECTOMY WITH RADIOACTIVE SEED AND SENTINEL LYMPH NODE BIOPSY Left 11/12/2015   Procedure: BREAST LUMPECTOMY WITH RADIOACTIVE SEED AND SENTINEL LYMPH NODE BIOPSY;  Surgeon: Excell Seltzer, MD;  Location: [place];  Service: General;  Laterality: Left;  . BREAST REDUCTION SURGERY Bilateral 11/26/2015   Procedure: MAMMARY REDUCTION  (BREAST) FOR ASYMMETRY;  Surgeon: Wallace Going, DO;  Location: [place];  Service: Plastics;  Laterality: Bilateral;  . LIPOSUCTION Bilateral 11/26/2015   Procedure: LIPOSUCTION;  Surgeon: Wallace Going, DO;  Location: [place];  Service: Plastics;  Laterality: Bilateral;  . REDUCTION MAMMAPLASTY Bilateral 10/2015  . TOTAL HIP ARTHROPLASTY Right     There were no vitals filed for this visit.  Subjective Assessment - 05/03/18 1353    Subjective  Pt has been doing a lot of research on supplements to try to try to help decrease her pain and manage her symptoms.     Patient Stated  Goals  Pt wants to exercise to prevent recurrance of cancer, to increase her strength an fitness,     Currently in Pain?  No/denies                       [facility] Adult PT Treatment/Exercise - 05/03/18 0001      Exercises   Exercises  Shoulder      Shoulder Exercises: Standing   External Rotation  Strengthening;Right;Left    Theraband Level (Shoulder External Rotation)  Level 2 (Red)    External Rotation Limitations  left scapula depressed and left UE held steady while right arm pulls on band for concentric contraction on right and isometric contraction on left     Row  Strengthening;Right;Left;20 reps;Theraband    Theraband Level (Shoulder Row)  Level 2 (Red)    Row Limitations  cues to slow down, keep scapulae depresses and in stable position to minimize clunking of joint       Modalities   Modalities  Moist Heat      Moist Heat Therapy   Number Minutes Moist Heat  10 Minutes    Moist Heat Location  Shoulder   left      Manual Therapy   Manual Therapy  Soft tissue mobilization    Soft tissue mobilization  pt in right sidelying, with thick massage cream, soft tissue work to left scapular and shoulder muscles  PT Education - 05/03/18 1603    Education Details  home program for rows and external rotation exercise for UE     Person(s) Educated  Patient    Methods  Explanation;Demonstration;Handout   send [place] link to text    Comprehension  Verbalized understanding;Returned demonstration          PT Long Term Goals - 04/27/18 2142      PT LONG TERM GOAL #1   Title  Pt will have 120 degrees of left shoulder abduction painfree so that she can perform her ADLs better    Baseline  40 degrees and painful     Time  4    Period  Weeks    Status  New      PT LONG TERM GOAL #2   Title  Pt will be indpendent in a home exercise program for strengthening left upper quadrant     Time  4    Period  Weeks    Status  New      PT LONG TERM  GOAL #3   Title  Pt will report she feels that she is generally stronger and better able to deal with the challenges of her everyday life by 50%    Time  4    Period  Weeks    Status  New            Plan - 05/03/18 1605    Clinical Impression Statement  Pt is doing better today with less pain and more AROM.  She still has decreased strength in left sholder with increased "clunking" with pain.  She was able to minimize this by focusing on keeping scapulae stabalized     Rehab Potential  Good    Clinical Impairments Affecting Rehab Potential   previous left shoulder injury with instability.  Pt reports she has been told she "needs a totatl shoulder" previous breast surgeries     PT Frequency  2x / week    PT Duration  4 weeks    PT Treatment/Interventions  ADLs/Self Care Home Management;Therapeutic exercise;Therapeutic activities;Manual techniques;Patient/family education;Taping    PT Next Visit Plan  Cointinue with soft tissue work and postureal exercises , add shoulder isometrics and progress    Consulted and Agree with Plan of Care  Patient       Patient will benefit from skilled therapeutic intervention in order to improve the following deficits and impairments:  Decreased range of motion, Decreased strength, Decreased activity tolerance, Postural dysfunction, Pain, Impaired UE functional use, Increased fascial restricitons, Increased muscle spasms, Impaired perceived functional ability  Visit Diagnosis: Stiffness of left shoulder joint  Chronic left shoulder pain  Abnormal posture  Muscle weakness (generalized)     Problem List Patient Active Problem List   Diagnosis Date Noted  . Carcinoma of upper-outer quadrant of left female breast ([place]) 09/05/2015  . Post-traumatic osteoarthritis of right knee 08/29/2014  . Shoulder dislocation, recurrent 07/29/2010  . DEGENERATIVE JOINT DISEASE, LEFT SHOULDER 03/19/2010  . DEGENERATIVE JOINT DISEASE, ANKLE 11/19/2009  . ANXIETY  05/12/2006  . DEPRESSIVE DISORDER, NOS 05/12/2006  . DIVERTICULITIS OF COLON, NOS 05/12/2006  . MENOPAUSAL SYNDROME 05/12/2006   Donato Heinz. Owens Shark PT  [name] 05/03/2018, 4:10 PM  [place], [place], [address] Phone: [phone]   Fax:  [phone]  Name: Kelly Hancock MRN: [id number] Date of Birth: [birth year]

## 2018-05-05 ENCOUNTER — Encounter: Payer: Medicare Other | Admitting: Physical Therapy

## 2018-05-09 ENCOUNTER — Encounter: Payer: Self-pay | Admitting: Physical Therapy

## 2018-05-09 ENCOUNTER — Ambulatory Visit: Payer: Medicare Other | Admitting: Physical Therapy

## 2018-05-09 DIAGNOSIS — M25512 Pain in left shoulder: Secondary | ICD-10-CM

## 2018-05-09 DIAGNOSIS — M25612 Stiffness of left shoulder, not elsewhere classified: Secondary | ICD-10-CM | POA: Diagnosis not present

## 2018-05-09 DIAGNOSIS — R293 Abnormal posture: Secondary | ICD-10-CM

## 2018-05-09 DIAGNOSIS — G8929 Other chronic pain: Secondary | ICD-10-CM

## 2018-05-09 DIAGNOSIS — M6281 Muscle weakness (generalized): Secondary | ICD-10-CM

## 2018-05-09 NOTE — Patient Instructions (Signed)
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
## 2018-05-17 ENCOUNTER — Encounter: Payer: Self-pay | Admitting: Physical Therapy

## 2018-05-17 ENCOUNTER — Ambulatory Visit: Payer: Medicare Other | Attending: Hematology | Admitting: Physical Therapy

## 2018-05-17 DIAGNOSIS — M6281 Muscle weakness (generalized): Secondary | ICD-10-CM | POA: Diagnosis present

## 2018-05-17 DIAGNOSIS — R293 Abnormal posture: Secondary | ICD-10-CM | POA: Diagnosis present

## 2018-05-17 DIAGNOSIS — M25612 Stiffness of left shoulder, not elsewhere classified: Secondary | ICD-10-CM | POA: Insufficient documentation

## 2018-05-17 DIAGNOSIS — G8929 Other chronic pain: Secondary | ICD-10-CM | POA: Insufficient documentation

## 2018-05-17 DIAGNOSIS — M25512 Pain in left shoulder: Secondary | ICD-10-CM | POA: Diagnosis present
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
## 2018-05-18 ENCOUNTER — Ambulatory Visit: Payer: Medicare Other | Admitting: Physical Therapy

## 2018-05-18 ENCOUNTER — Encounter: Payer: Self-pay | Admitting: Physical Therapy

## 2018-05-18 DIAGNOSIS — G8929 Other chronic pain: Secondary | ICD-10-CM

## 2018-05-18 DIAGNOSIS — R293 Abnormal posture: Secondary | ICD-10-CM

## 2018-05-18 DIAGNOSIS — M25512 Pain in left shoulder: Secondary | ICD-10-CM

## 2018-05-18 DIAGNOSIS — M25612 Stiffness of left shoulder, not elsewhere classified: Secondary | ICD-10-CM | POA: Diagnosis not present

## 2018-05-18 DIAGNOSIS — M6281 Muscle weakness (generalized): Secondary | ICD-10-CM
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
## 2018-05-23 ENCOUNTER — Ambulatory Visit: Payer: Medicare Other | Admitting: Physical Therapy

## 2018-05-23 ENCOUNTER — Encounter: Payer: Self-pay | Admitting: Physical Therapy

## 2018-05-23 DIAGNOSIS — G8929 Other chronic pain: Secondary | ICD-10-CM

## 2018-05-23 DIAGNOSIS — M6281 Muscle weakness (generalized): Secondary | ICD-10-CM

## 2018-05-23 DIAGNOSIS — M25612 Stiffness of left shoulder, not elsewhere classified: Secondary | ICD-10-CM | POA: Diagnosis not present

## 2018-05-23 DIAGNOSIS — R293 Abnormal posture: Secondary | ICD-10-CM

## 2018-05-23 DIAGNOSIS — M25512 Pain in left shoulder: Secondary | ICD-10-CM
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
## 2018-05-25 ENCOUNTER — Other Ambulatory Visit: Payer: Self-pay

## 2018-05-25 ENCOUNTER — Ambulatory Visit: Payer: Medicare Other | Admitting: Physical Therapy

## 2018-05-25 DIAGNOSIS — G8929 Other chronic pain: Secondary | ICD-10-CM

## 2018-05-25 DIAGNOSIS — M25612 Stiffness of left shoulder, not elsewhere classified: Secondary | ICD-10-CM

## 2018-05-25 DIAGNOSIS — R293 Abnormal posture: Secondary | ICD-10-CM

## 2018-05-25 DIAGNOSIS — M25512 Pain in left shoulder: Secondary | ICD-10-CM

## 2018-05-25 DIAGNOSIS — M6281 Muscle weakness (generalized): Secondary | ICD-10-CM
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
## 2018-05-30 ENCOUNTER — Ambulatory Visit: Payer: Medicare Other | Admitting: Physical Therapy

## 2018-05-30 ENCOUNTER — Encounter: Payer: Self-pay | Admitting: Physical Therapy

## 2018-05-30 ENCOUNTER — Other Ambulatory Visit: Payer: Self-pay

## 2018-05-30 DIAGNOSIS — R293 Abnormal posture: Secondary | ICD-10-CM

## 2018-05-30 DIAGNOSIS — M6281 Muscle weakness (generalized): Secondary | ICD-10-CM

## 2018-05-30 DIAGNOSIS — M25512 Pain in left shoulder: Secondary | ICD-10-CM

## 2018-05-30 DIAGNOSIS — G8929 Other chronic pain: Secondary | ICD-10-CM

## 2018-05-30 DIAGNOSIS — M25612 Stiffness of left shoulder, not elsewhere classified: Secondary | ICD-10-CM | POA: Diagnosis not present
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
## 2018-06-01 ENCOUNTER — Encounter: Payer: Medicare Other | Admitting: Physical Therapy

## 2018-10-18 ENCOUNTER — Ambulatory Visit: Payer: Medicare Other | Admitting: Hematology

## 2018-10-18 ENCOUNTER — Other Ambulatory Visit: Payer: Medicare Other

## 2019-03-13 ENCOUNTER — Ambulatory Visit: Payer: Medicare Other | Attending: Internal Medicine

## 2019-03-13 DIAGNOSIS — Z20822 Contact with and (suspected) exposure to covid-19: Secondary | ICD-10-CM

## 2019-03-14 LAB — NOVEL CORONAVIRUS, NAA: SARS-CoV-2, NAA: NOT DETECTED

## 2020-08-28 ENCOUNTER — Ambulatory Visit (INDEPENDENT_AMBULATORY_CARE_PROVIDER_SITE_OTHER): Payer: Medicare Other | Admitting: Sports Medicine

## 2020-08-28 ENCOUNTER — Other Ambulatory Visit: Payer: Self-pay

## 2020-08-28 DIAGNOSIS — M25562 Pain in left knee: Secondary | ICD-10-CM | POA: Diagnosis not present

## 2020-08-28 MED ORDER — METHYLPREDNISOLONE ACETATE 40 MG/ML IJ SUSP
40.0000 mg | Freq: Once | INTRAMUSCULAR | Status: AC
  Administered 2020-08-28: 40 mg via INTRA_ARTICULAR

## 2020-08-28 NOTE — Progress Notes (Signed)
Kelly Hancock [place] - 75 y.o. female MRN [id number]  Date of birth: [birth year]  SUBJECTIVE:    CC: Left Knee Pain  Kelly Hancock presents with complaints of left knee pain after exercising in the pool on Sunday. The pain started after getting out of the pool and progressively worsened into Monday. The pain is located at the anterior portion of her knee across her patella tendon. She has noticed some popping but the knee does not get stuck on her. She is now unable to ambulate without pain. She is using crutches. She has taking some ibuprofen which has relieved the pain some but not a lot. She does think she had an injection in this knee prior. She has not had surgery on this knee. It has been slightly swollen per the patient.   Past Hx  OA of RT ankle and RT knee Probably OA of left knee OA of shoulders (Long hx of gymnastics) Breast Cancer  Objective:  VS: BP:126/84  HR: bpm  TEMP: ( )  RESP:   HT:5\' 3"  (160 cm)   WT:135 lb (61.2 kg)  BMI:23.92 PHYSICAL EXAM:  Well appearing woman walking with a limp and crutches.  Left Knee: Mild effusion. No erythema.  TTP at the anterior aspect of the knee along the medial and lateral aspects of the patellar tendon. Nontender along the lateral joint line.  Pain to patella seems to start along medial joint line anteriorly.  FROM in flexion and extension with pain.  5/5 strength in flexion and extension. Positive mcmurrays. Negative lachmans and valgus/varus stress tests.  Antalgic gait and does not want to bear full weight  Left Knee Ultrasound: Suprapatellar pouch visualized with hypochoic area consistent with moderate effusion.  Medial meniscus visualized with extrusion of the meniscus from the joint space. Positive geyser sign. Hypoechoic pouch surrounding the meniscus.  There is medial spurring of femur and tibia along joint line Lateral meniscus visualized without abnormality. Minimal spurring Patellar tendon visualized with intact fibers    Impression: Effusion of the knee joint with extrusion of the medial meniscus consistent with meniscal injury. Suspect underlying OA with spurring noted medially  Ultrasound and interpretation by Wolfgang Phoenix. Fields, MD    ASSESSMENT & PLAN:  Left knee Pain: I suspect that she has a meniscal injury 2/2 her exercise in the pool and as evidenced by her ultrasound findings. She is in significant pain at the moment so we will inject the knee today for acute relief. She should walk on crutches for the next week and then progress to walking without as the knee begins to feel better. She can do water walking in the meantime to rehab the knee. We will also give her some exercises to strengthen the quad musculature. We advise wearing a compression sleeve to reduce the swelling. She can continue to take ibuprofen or tylenol for pain relief as needed. She will follow up with [place] again in 2 weeks for reevaluation.  Left knee ultrasound guided Cortisone Injection:  Risks and benefits of procedure discussed, Patient opted to proceed. Informed written consent obtained.  Timeout performed.  Skin prepped in a sterile fashion with alcohol swabs. Ethyl Chloride was used for topical analgesia.  Sterile gel was applied to the linear probe and placed over the suprapatellar area of the knee. The suprapatellar pouch was visualized with effusion present. The left knee was injected with 3cc 1% Lidocaine without epinephrine and 1cc 40 Solumdedrol via the suprapatellar  approach using a 25G, 1.5in needle.  Patient  tolerated the injection well with no immediate complications. Aftercare instructions were discussed, and patient was given strict return precautions.    Kelly Hancock, MS4   I observed and examined the patient with the MS 4 and agree with assessment and plan.  Note reviewed and modified by me. Ila Mcgill, MD

## 2020-08-28 NOTE — Assessment & Plan Note (Signed)
We treated her with corticosteroid injection today along with a compression sleeve and icing She will do relative rest and use crutches for the next week We will follow her up to see if we need to do further work-up for osteoarthritis

## 2020-09-02 ENCOUNTER — Ambulatory Visit: Payer: Medicare Other | Admitting: Sports Medicine

## 2020-09-11 ENCOUNTER — Ambulatory Visit (INDEPENDENT_AMBULATORY_CARE_PROVIDER_SITE_OTHER): Payer: Medicare Other | Admitting: Sports Medicine

## 2020-09-11 ENCOUNTER — Other Ambulatory Visit: Payer: Self-pay

## 2020-09-11 DIAGNOSIS — M1731 Unilateral post-traumatic osteoarthritis, right knee: Secondary | ICD-10-CM

## 2020-09-11 NOTE — Assessment & Plan Note (Signed)
With compression crutches and relative rest she has improved significantly Given some easy quadricep strengthening exercises today Gradually increase her walking and pool exercises over the next month Consider trying some stationary biking  Reevaluate in 1 month

## 2020-09-11 NOTE — Progress Notes (Signed)
Left knee pain and swelling  Patient returns 2 weeks after an acute knee injury to her already arthritic left knee.  On last visit she could not walk secondary to pain.  We did a steroid injection and put her on crutches.  She states that after about 10 days she was able to start walking better and was able to get away from the crutches.  She feels that she is now able to walk and do normal activities without any severe pain.  She feels that she has less swelling.  She is still uncomfortable going down stairs walking too much or bending the knee completely.  Review of systems No locking No giving way No redness  Physical exam Pleasant older lady in no acute distress Ht 5\' 4"  (1.626 m)   Wt 135 lb (61.2 kg)   BMI 23.17 kg/m   Left knee shows mild swelling in the suprapatellar pouch The knee is irregular in its contour with some palpable spurring along both joint lines She is able to flex the knee better to about 130 degrees She can get almost full extension Walking gait is cautious but she does not appear to be having much pain  Repeat ultrasound examination Knee effusion appears to be reduced by about 50% Spurring along the medial joint line and fragmentation of the meniscus Note there is much less swelling over this medial joint line Lateral joint lines show some spurring  Impression findings consistent with osteoarthritis and degenerative meniscal changes.  Now with mild to moderate effusion  Ultrasound and interpretation by Peterson Ao B. Oneida Alar, MD

## 2020-10-09 ENCOUNTER — Ambulatory Visit: Payer: Medicare Other | Admitting: Sports Medicine

## 2020-10-23 ENCOUNTER — Other Ambulatory Visit: Payer: Self-pay

## 2020-10-23 ENCOUNTER — Ambulatory Visit (INDEPENDENT_AMBULATORY_CARE_PROVIDER_SITE_OTHER): Payer: Medicare Other | Admitting: Sports Medicine

## 2020-10-23 VITALS — Ht 64.0 in | Wt 135.0 lb

## 2020-10-23 DIAGNOSIS — M1731 Unilateral post-traumatic osteoarthritis, right knee: Secondary | ICD-10-CM

## 2020-10-23 DIAGNOSIS — M19071 Primary osteoarthritis, right ankle and foot: Secondary | ICD-10-CM | POA: Diagnosis not present

## 2020-10-23 NOTE — Assessment & Plan Note (Addendum)
Improved with mild swelling apparent on ultrasound exam today. Continue compression. Gradually increase walking and activity as able. Try stationary bike and swimming, increasing as able. Voltaren gel and NSAIDs for pain relief. F/u prn.

## 2020-10-23 NOTE — Progress Notes (Addendum)
   Kelly Hancock is a 75 y.o. female who presents to [facility] today for the following:  Follow up of L knee pain Last visit 09/11/2020 for 2 week f/u after L knee injury. At that time had improvement in pain after steroid injection and walking with crutches. She is now off crutches. Reports she was initially doing well with exercising, walking multiple miles per day and swimming after last visit but contracted COVID in July which caused more inflammation and pain in her joints. Since healing, she is now up to 1 mile per day but has not yet resumed swimming, quad strength exercises or biking. She can bend her knee much better. Reports increase in R ankle swelling and pain with known chronic ankle fracture, denies new injury.  PMH reviewed.  ROS as above. Medications reviewed.  Exam:  Ht '5\' 4"'$  (1.626 m)   Wt 135 lb (61.2 kg)   BMI 23.17 kg/m  Gen: Well NAD MSK: L Knee: no obvious swelling, erythema, warmth. NonTTP over bilateral joint lines. ~140-150 degree flexion. 4/5 quad strength. Negative thessaly, Lachman. Neurovascularly intact. R ankle: swelling diffusely with slight warmth, no erythema. No overlying rashes or lesions. Limited dorsiflexion. Unable to plantar flex without pain. Limited eversion. Palpable DP and PT pulses. Negative ant/post drawer.  Ultrasound of L knee Mild swelling in suprapatellar pouch, improved since last scan approaching physiologic amount. Spurring along bilateral joint lines again seen.   Ultrasound of R ankle Mild swelling around medial and lateral ankle joint with significant spurring and cortical irregularity. No tendinous pathology seen.   Impression findings consistent with OA and degenerative menisci of L knee and chronic ankle fracture, healed. Stepoff and irregularity at lateral malleolus with intact cortex  Ultrasound performed and interpreted by Dr. Ky Barban and Dr. Oneida Alar.   Assessment and Plan: Post-traumatic osteoarthritis of right knee Improved  with mild swelling apparent on ultrasound exam today. Continue compression. Gradually increase walking and activity as able. Try stationary bike and swimming, increasing as able. Voltaren gel and NSAIDs for pain relief. F/u prn.  Osteoarthritis of ankle and foot S/p healed fracture. Mild swelling seen today on exam and ultrasound. Fitted for body helix sleeve today. Recommend compression, NSAIDs for pain relief, ice. Stationary bike for strength and mobility. F/u prn.   Rory Percy, DO PGY-4 [place] [facility] Fellow 10/23/2020 5:31 PM  I observed and examined the patient with the [place] resident and agree with assessment and plan.  Note reviewed and modified by me. Ila Mcgill, MD

## 2020-10-23 NOTE — Assessment & Plan Note (Signed)
S/p healed fracture. Mild swelling seen today on exam and ultrasound. Fitted for body helix sleeve today. Recommend compression, NSAIDs for pain relief, ice. Stationary bike for strength and mobility. F/u prn.

## 2021-04-03 ENCOUNTER — Telehealth: Payer: Self-pay

## 2021-04-03 NOTE — Telephone Encounter (Signed)
Patient husband came by the office and stated the patient was a previous patient of Dr. Jonni Sanger and would like to come back to her. Told patient Dr. Jonni Sanger wasn't accept new patients and wanted me to send a message back.

## 2021-04-13 NOTE — Telephone Encounter (Signed)
Patient notified. Aware we have a new provider Dr Cherlynn Kaiser  Patient will like a new patient appointment

## 2021-04-13 NOTE — Telephone Encounter (Signed)
Please advise 

## 2021-04-14 NOTE — Telephone Encounter (Signed)
Patient has been scheduled

## 2021-05-26 ENCOUNTER — Encounter: Payer: Self-pay | Admitting: Family Medicine

## 2021-05-26 ENCOUNTER — Ambulatory Visit (INDEPENDENT_AMBULATORY_CARE_PROVIDER_SITE_OTHER): Payer: Medicare Other | Admitting: Family Medicine

## 2021-05-26 VITALS — BP 140/88 | HR 85 | Temp 97.9°F | Ht 64.0 in | Wt 151.5 lb

## 2021-05-26 DIAGNOSIS — F5101 Primary insomnia: Secondary | ICD-10-CM

## 2021-05-26 DIAGNOSIS — F411 Generalized anxiety disorder: Secondary | ICD-10-CM | POA: Diagnosis not present

## 2021-05-26 DIAGNOSIS — H6123 Impacted cerumen, bilateral: Secondary | ICD-10-CM

## 2021-05-26 DIAGNOSIS — C50412 Malignant neoplasm of upper-outer quadrant of left female breast: Secondary | ICD-10-CM | POA: Diagnosis not present

## 2021-05-26 MED ORDER — MIRTAZAPINE 7.5 MG PO TABS: 7.5000 mg | ORAL_TABLET | Freq: Every day | ORAL | 1 refills | Status: AC
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
## 2021-05-26 NOTE — Patient Instructions (Signed)
Welcome to [facility]! It was a pleasure meeting you today.  As discussed, Please schedule a 1 month follow up visit today.  PLEASE NOTE:  If you had any LAB tests please let us know if you have not heard back within a few days. You may see your results on MyChart before we have a chance to review them but we will give you a call once they are reviewed by us. If we ordered any REFERRALS today, please let us know if you have not heard from their office within the next week.  Let us know through MyChart if you are needing REFILLS, or have your pharmacy send us the request. You can also use MyChart to communicate with me or any office staff.  Please try these tips to maintain a healthy lifestyle:  Eat most of your calories during the day when you are active. Eliminate processed foods including packaged sweets (pies, cakes, cookies), reduce intake of potatoes, white bread, white pasta, and white rice. Look for whole grain options, oat flour or almond flour.  Each meal should contain half fruits/vegetables, one quarter protein, and one quarter carbs (no bigger than a computer mouse).  Cut down on sweet beverages. This includes juice, soda, and sweet tea. Also watch fruit intake, though this is a healthier sweet option, it still contains natural sugar! Limit to 3 servings daily.  Drink at least 1 glass of water with each meal and aim for at least 8 glasses per day  Exercise at least 150 minutes every week.   

## 2021-06-26 ENCOUNTER — Ambulatory Visit: Payer: Medicare Other | Admitting: Family Medicine

## 2021-10-08 DIAGNOSIS — L237 Allergic contact dermatitis due to plants, except food: Secondary | ICD-10-CM | POA: Diagnosis not present

## 2021-11-26 DIAGNOSIS — I10 Essential (primary) hypertension: Secondary | ICD-10-CM | POA: Diagnosis not present

## 2021-11-26 DIAGNOSIS — Z139 Encounter for screening, unspecified: Secondary | ICD-10-CM | POA: Diagnosis not present

## 2021-11-26 DIAGNOSIS — Z853 Personal history of malignant neoplasm of breast: Secondary | ICD-10-CM | POA: Diagnosis not present

## 2022-01-04 DIAGNOSIS — H6120 Impacted cerumen, unspecified ear: Secondary | ICD-10-CM | POA: Diagnosis not present

## 2022-01-04 DIAGNOSIS — I10 Essential (primary) hypertension: Secondary | ICD-10-CM | POA: Diagnosis not present

## 2022-01-18 DIAGNOSIS — L720 Epidermal cyst: Secondary | ICD-10-CM | POA: Diagnosis not present

## 2022-01-18 DIAGNOSIS — D2262 Melanocytic nevi of left upper limb, including shoulder: Secondary | ICD-10-CM | POA: Diagnosis not present

## 2022-01-18 DIAGNOSIS — L821 Other seborrheic keratosis: Secondary | ICD-10-CM | POA: Diagnosis not present

## 2022-01-18 DIAGNOSIS — D225 Melanocytic nevi of trunk: Secondary | ICD-10-CM | POA: Diagnosis not present

## 2022-02-01 ENCOUNTER — Telehealth: Payer: Self-pay

## 2022-02-01 NOTE — Telephone Encounter (Signed)
States her knee pain flared up recently and is having to use crutches to ambulate. She's been icing, elevating but hasn't taken anything for pain as NSAIDs haven't helped in the past. I instructed her to wear her knee compression sleeve if tolerated or ace wrap. She is scheduled to see Dr. Oneida Alar on 11/30 and will be notified of any sooner cancellations. At this time she only wishes to see Dr. Oneida Alar.

## 2022-02-02 ENCOUNTER — Ambulatory Visit (INDEPENDENT_AMBULATORY_CARE_PROVIDER_SITE_OTHER): Payer: Medicare Other | Admitting: Sports Medicine

## 2022-02-02 ENCOUNTER — Ambulatory Visit: Payer: Medicare Other | Admitting: Sports Medicine

## 2022-02-02 ENCOUNTER — Other Ambulatory Visit: Payer: Self-pay | Admitting: Sports Medicine

## 2022-02-02 VITALS — BP 155/87 | Ht 64.0 in | Wt 151.0 lb

## 2022-02-02 DIAGNOSIS — M1731 Unilateral post-traumatic osteoarthritis, right knee: Secondary | ICD-10-CM

## 2022-02-02 DIAGNOSIS — M25562 Pain in left knee: Secondary | ICD-10-CM

## 2022-02-02 MED ORDER — METHYLPREDNISOLONE ACETATE 40 MG/ML IJ SUSP
40.0000 mg | Freq: Once | INTRAMUSCULAR | Status: AC
  Administered 2022-02-02: 40 mg via INTRA_ARTICULAR

## 2022-02-02 MED ORDER — HYDROCODONE-ACETAMINOPHEN 5-325 MG PO TABS: 1.0000 | ORAL_TABLET | Freq: Four times a day (QID) | ORAL | 0 refills | Status: DC | PRN

## 2022-02-02 NOTE — Progress Notes (Signed)
   Subjective:    Patient ID: Kelly Hancock, female    DOB: 1945/05/08, 76 y.o.   MRN: [id number]  HPI chief complaint: Left knee pain and swelling  Kelly Hancock is a very pleasant 76 year old female that presents today with 3 days of left knee pain and swelling.  She had similar pain in the past.  No recent trauma.  She was seen by Dr. Oneida Alar in 2022 with a similar presentation.  She responded well to a cortisone injection, compression, crutches, and eventually home exercises.  Her swelling was worse last night but has improved some today.  She is ambulating with crutches.    Review of Systems As above    Objective:   Physical Exam  Well-developed, well-nourished.  No acute distress  Left knee: 1+ effusion.  Limited range of motion.  Knee is not erythematous nor warm to touch.  Slight tenderness along the medial joint line.  Neurovascular intact distally.  Ambulating with assistance of crutches.      Assessment & Plan:   Left knee pain and swelling likely secondary to DJD  Since the presentation is similar to 2022 we will treat this identically.  Left knee is injected today with cortisone.  This is done atraumatically under sterile technique.  She tolerates this without difficulty.  An anterior lateral approach was utilized.  We will use an Ace wrap for compression until swelling subsides and she can then transition back to her compression sleeve.  She will continue icing and we will also provide a prescription for Norco to take as needed for more severe pain.  She has a follow-up appointment with Dr. Oneida Alar next week and she will keep that appointment as scheduled.   Consent obtained and verified. Time-out conducted. Noted no overlying erythema, induration, or other signs of local infection. Skin prepped in a sterile fashion. Topical analgesic spray: Ethyl chloride. Joint: Left knee Needle: 25-gauge 1.5 inch Completed without difficulty. Meds: 3 cc 1% Xylocaine, 1 cc (40 mg)  Depo-Medrol  Advised to call if fevers/chills, erythema, induration, drainage, or persistent bleeding.

## 2022-02-09 ENCOUNTER — Ambulatory Visit (INDEPENDENT_AMBULATORY_CARE_PROVIDER_SITE_OTHER): Payer: Medicare Other | Admitting: Sports Medicine

## 2022-02-09 VITALS — BP 154/87 | Ht 64.0 in | Wt 151.0 lb

## 2022-02-09 DIAGNOSIS — M25562 Pain in left knee: Secondary | ICD-10-CM

## 2022-02-09 DIAGNOSIS — M1712 Unilateral primary osteoarthritis, left knee: Secondary | ICD-10-CM

## 2022-02-09 DIAGNOSIS — M19071 Primary osteoarthritis, right ankle and foot: Secondary | ICD-10-CM

## 2022-02-09 DIAGNOSIS — M1711 Unilateral primary osteoarthritis, right knee: Secondary | ICD-10-CM

## 2022-02-09 NOTE — Assessment & Plan Note (Signed)
The Right sided arthritis probably has her placing more stress onto the left leg and knee Consider if we need to provide better foot support

## 2022-02-09 NOTE — Progress Notes (Addendum)
  SUBJECTIVE:   Chief Complaint: left knee pain  History of Presenting Illness:   Kelly Hancock is a 76 year old female with a past medical history of L shoulder, R hip, R knee, and R ankle OA who presents for follow-up of L knee pain. She was last seen at the [facility] one week ago on 11-21 for her L knee pain, which has been diagnosed as degenerative joint disease. At that time she received a corticosteroid injection and short course of acetaminophen-hydrocodone.  Upon returning home on 11-21, her knee felt good and she woke up the next day feeling great. However, once she started ambulating later that morning, the pain returned. It was bothersome, but had improved compared to before the injection. She was on her feet a lot over the holiday and continued to experience this left knee discomfort. Has been taking Norco sparingly, only used 5 out of the 15 tablets provided. Additionally, she has been icing and using compression to reduce inflammation as instructed. Today, she feels as though the pain has improved slightly since last week, hypothesizing that the steroid injection has probably started to take effect.     Pertinent Medical History:   Past Medical History:  Diagnosis Date   History of breast cancer 09/2015   L   Osteoarthritis    right hip   PONV (postoperative nausea and vomiting)       OBJECTIVE:   There were no vitals taken for this visit.  Physical Examination:  Left Knee No erythema, mild swelling over superolateral and inferomedial aspects of anterior knee, limited RoM on flexion and extension  Right Ankle No erythema, significant swelling over lateral malleolus, limited RoM with inversion and eversion, strength 5 /5 with plantar and dorsiflexion    ASSESSMENT/PLAN:   Patient's presentation is most consistent with degenerative joint disease.  > Radiograph both knees with weight-bearing to evaluate interval change > Cryotherapy and compression to reduce  inflammation > Strengthening exercises targeting quadriceps supplemented by sufficient dietary protein > Acetaminophen-hydrocodone sparingly for pain relief > Follow-up in about four weeks   There are no diagnoses linked to this encounter.  No follow-ups on file.  Roswell Nickel, MD  02/09/2022, 8:48 AM    Examined and saw the patient with Dr. Jodi Mourning. Important additions - I think she is at least moderately improved and probably has had more impact of CSI than she realizes.  She is having none of the most sever pain. She is only taking a rare Norco.  She is out starting to do a few things.  Exam A couple of key things to note Left knee based on previous note must have significant reduction in effusion She can now extend to within 5 deg of full We can flex past 90 deg  RT ankle severe arthritic change noted with shift of lateral malleolus/ foot is medially place in the mortise.  No subtalr motion.  Foot shape is significant in that she has a cavus foot Lots of breakdown of the plantar plate with widening of forefoot and loss of transverse arch bulat/ splayed toes/ bunionettes On left foot long arch is starting to drop  Agree this is an arthritic flare with arthritis of left knee and probably degenerative meniscus  Check standing films  Ila Mcgill, MD

## 2022-02-09 NOTE — Assessment & Plan Note (Signed)
This is more severe with pain flare  Will check to see degree of OA with standing XR

## 2022-02-11 ENCOUNTER — Ambulatory Visit: Payer: Medicare Other | Admitting: Sports Medicine

## 2022-02-11 ENCOUNTER — Other Ambulatory Visit: Payer: Self-pay | Admitting: Student

## 2022-02-11 ENCOUNTER — Ambulatory Visit
Admission: RE | Admit: 2022-02-11 | Discharge: 2022-02-11 | Disposition: A | Discharge status: Home / Self Care | Payer: Medicare Other | Source: Ambulatory Visit | Attending: Sports Medicine | Admitting: Sports Medicine

## 2022-02-11 DIAGNOSIS — M1712 Unilateral primary osteoarthritis, left knee: Secondary | ICD-10-CM

## 2022-02-11 DIAGNOSIS — M25562 Pain in left knee: Secondary | ICD-10-CM

## 2022-02-11 DIAGNOSIS — M170 Bilateral primary osteoarthritis of knee: Secondary | ICD-10-CM | POA: Diagnosis not present

## 2022-02-11 DIAGNOSIS — M1711 Unilateral primary osteoarthritis, right knee: Secondary | ICD-10-CM

## 2022-02-11 DIAGNOSIS — M19071 Primary osteoarthritis, right ankle and foot: Secondary | ICD-10-CM

## 2022-02-15 DIAGNOSIS — I10 Essential (primary) hypertension: Secondary | ICD-10-CM | POA: Diagnosis not present

## 2022-03-04 ENCOUNTER — Ambulatory Visit (INDEPENDENT_AMBULATORY_CARE_PROVIDER_SITE_OTHER): Payer: Medicare Other | Admitting: Sports Medicine

## 2022-03-04 VITALS — BP 140/94 | Ht 64.0 in | Wt 151.0 lb

## 2022-03-04 DIAGNOSIS — M1731 Unilateral post-traumatic osteoarthritis, right knee: Secondary | ICD-10-CM | POA: Diagnosis not present

## 2022-03-04 DIAGNOSIS — M1712 Unilateral primary osteoarthritis, left knee: Secondary | ICD-10-CM | POA: Diagnosis not present

## 2022-03-04 DIAGNOSIS — M1711 Unilateral primary osteoarthritis, right knee: Secondary | ICD-10-CM

## 2022-03-04 MED ORDER — HYDROCODONE-ACETAMINOPHEN 5-325 MG PO TABS: 1.0000 | ORAL_TABLET | Freq: Four times a day (QID) | ORAL | 0 refills | Status: DC | PRN

## 2022-03-04 NOTE — Assessment & Plan Note (Signed)
Patient advised that the best we can offer is good rehabilitation She has severe arthritis of her right ankle which hurts with biking or walking so we will aim to put her in a water physical therapy program She will use Tylenol and aspirin but only use the Norco for rescue at nighttime if needed If this gets to severe she can consider joint replacement

## 2022-03-04 NOTE — Progress Notes (Signed)
Chief complaint bilateral knee pain  Patient has been having increasingly worse knee pain.  On her last flareup in November we had to give her some Norco to get her through the night.  She used 10 total tablets in the last 30 days.  We got bilateral knee x-rays.  The right knee shows lateral compartment advanced arthritis and grade 4 patellofemoral arthritis.  The left knee shows grade 4 medial compartment arthritis and grade 4 patellofemoral arthritis.  Since her last visit she has done some water exercises which seem to help and easy motion exercises. Her swelling has been significantly reduced with icing. She is able to walk more but for any prolonged walking is using a cane.  Physical exam Pleasant older lady in no acute distress BP (!) 140/94   Ht '5\' 4"'$  (1.626 m)   Wt 151 lb (68.5 kg)   BMI 25.92 kg/m   Chronic squaring of both knees on appearance No large effusions today she might have small effusions in both knees. Flexion has improved dramatically bilaterally and is full on the right and minimally limited on the left. Extension is now full on both sides.  X-rays were reviewed with the patient with the description of the bicompartmental arthritis right and left

## 2022-03-12 DIAGNOSIS — U071 COVID-19: Secondary | ICD-10-CM | POA: Diagnosis not present

## 2022-03-19 ENCOUNTER — Ambulatory Visit (HOSPITAL_BASED_OUTPATIENT_CLINIC_OR_DEPARTMENT_OTHER): Payer: Medicare Other | Admitting: Physical Therapy

## 2022-03-25 ENCOUNTER — Ambulatory Visit (HOSPITAL_BASED_OUTPATIENT_CLINIC_OR_DEPARTMENT_OTHER): Payer: Medicare Other | Attending: Sports Medicine | Admitting: Physical Therapy

## 2022-03-25 ENCOUNTER — Encounter (HOSPITAL_BASED_OUTPATIENT_CLINIC_OR_DEPARTMENT_OTHER): Payer: Self-pay | Admitting: Physical Therapy

## 2022-03-25 ENCOUNTER — Other Ambulatory Visit: Payer: Self-pay

## 2022-03-25 DIAGNOSIS — M25561 Pain in right knee: Secondary | ICD-10-CM | POA: Insufficient documentation

## 2022-03-25 DIAGNOSIS — R262 Difficulty in walking, not elsewhere classified: Secondary | ICD-10-CM | POA: Diagnosis not present

## 2022-03-25 DIAGNOSIS — M25562 Pain in left knee: Secondary | ICD-10-CM | POA: Diagnosis not present

## 2022-03-25 DIAGNOSIS — G8929 Other chronic pain: Secondary | ICD-10-CM | POA: Diagnosis not present

## 2022-03-25 DIAGNOSIS — M6281 Muscle weakness (generalized): Secondary | ICD-10-CM | POA: Insufficient documentation

## 2022-03-25 DIAGNOSIS — M170 Bilateral primary osteoarthritis of knee: Secondary | ICD-10-CM | POA: Insufficient documentation
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
## 2022-03-29 ENCOUNTER — Ambulatory Visit (HOSPITAL_BASED_OUTPATIENT_CLINIC_OR_DEPARTMENT_OTHER): Payer: Medicare Other | Admitting: Physical Therapy

## 2022-03-29 ENCOUNTER — Encounter (HOSPITAL_BASED_OUTPATIENT_CLINIC_OR_DEPARTMENT_OTHER): Payer: Self-pay | Admitting: Physical Therapy

## 2022-03-29 DIAGNOSIS — R262 Difficulty in walking, not elsewhere classified: Secondary | ICD-10-CM

## 2022-03-29 DIAGNOSIS — G8929 Other chronic pain: Secondary | ICD-10-CM

## 2022-03-29 DIAGNOSIS — M25562 Pain in left knee: Secondary | ICD-10-CM | POA: Diagnosis not present

## 2022-03-29 DIAGNOSIS — M25561 Pain in right knee: Secondary | ICD-10-CM | POA: Diagnosis not present

## 2022-03-29 DIAGNOSIS — M6281 Muscle weakness (generalized): Secondary | ICD-10-CM | POA: Diagnosis not present

## 2022-03-29 DIAGNOSIS — M170 Bilateral primary osteoarthritis of knee: Secondary | ICD-10-CM | POA: Diagnosis not present

## 2022-03-29 NOTE — Therapy (Signed)
OUTPATIENT PHYSICAL THERAPY LOWER EXTREMITY TREATMENT   Patient Name: Kelly Hancock MRN: [id number] DOB:10/16/1945, 77 y.o., female Today's Date: 03/29/2022  END OF SESSION:  PT End of Session - 03/29/22 0914     Visit Number 2    Number of Visits 16    Date for PT Re-Evaluation 05/20/22    Authorization Type [employer] mcr    Progress Note Due on Visit 10    PT Start Time 0906    PT Stop Time 0945    PT Time Calculation (min) 39 min              Past Medical History:  Diagnosis Date   History of breast cancer 09/2015   L   Osteoarthritis    right hip   PONV (postoperative nausea and vomiting)    Past Surgical History:  Procedure Laterality Date   APPENDECTOMY     BREAST LUMPECTOMY Left 11/12/2015   BREAST LUMPECTOMY WITH RADIOACTIVE SEED AND SENTINEL LYMPH NODE BIOPSY Left 11/12/2015   Procedure: BREAST LUMPECTOMY WITH RADIOACTIVE SEED AND SENTINEL LYMPH NODE BIOPSY;  Surgeon: Excell Seltzer, MD;  Location: [place];  Service: General;  Laterality: Left;   BREAST REDUCTION SURGERY Bilateral 11/26/2015   Procedure: MAMMARY REDUCTION  (BREAST) FOR ASYMMETRY;  Surgeon: Wallace Going, DO;  Location: [place];  Service: Plastics;  Laterality: Bilateral;   LIPOSUCTION Bilateral 11/26/2015   Procedure: LIPOSUCTION;  Surgeon: Wallace Going, DO;  Location: [place];  Service: Plastics;  Laterality: Bilateral;   REDUCTION MAMMAPLASTY Bilateral 10/2015   TOTAL HIP ARTHROPLASTY Right    Patient Active Problem List   Diagnosis Date Noted   Mechanical knee pain, left 08/28/2020   Carcinoma of upper-outer quadrant of left female breast ([place]) 09/05/2015   Post-traumatic osteoarthritis of right knee 08/29/2014   Shoulder dislocation, recurrent 07/29/2010   DEGENERATIVE JOINT DISEASE, LEFT SHOULDER 03/19/2010   Osteoarthritis of ankle and foot 11/19/2009   GAD (generalized anxiety disorder) 05/12/2006   DEPRESSIVE  DISORDER, NOS 05/12/2006   DIVERTICULITIS OF COLON, NOS 05/12/2006   MENOPAUSAL SYNDROME 05/12/2006    PCP: Cyndi Bender, PA-C   REFERRING PROVIDER: Stefanie Libel, MD   REFERRING DIAG:  Diagnosis  M17.11 (ICD-10-CM) - Primary osteoarthritis of right knee  M17.12 (ICD-10-CM) - Primary osteoarthritis of left knee    THERAPY DIAG:  Chronic pain of left knee  Chronic pain of right knee  Difficulty in walking, not elsewhere classified  Muscle weakness (generalized)  Rationale for Evaluation and Treatment: Rehabilitation  ONSET DATE: >10 yrs  SUBJECTIVE:   SUBJECTIVE STATEMENT: Pt reports she bought some Oofos and they have helped her Rt ankle.  No other changes since eval.     PERTINENT HISTORY: Right ankle fx ~ 15 yrs ago: Pt fell down steps reset it by herself did not seek medical treatment. It did not set properly. Birth: right hip dislocatred.  Had THR x35 yrs ago, not ever revised.  Some discomfort PAIN:  Are you having pain? Yes: NPRS scale: 2-3/10  Pain location: bilat knees and Right ankle Pain description: splitting pain through left knee Aggravating factors: walking Relieving factors: sitting, hot shower  PRECAUTIONS: Knee and Fall  WEIGHT BEARING RESTRICTIONS: No  FALLS:  Has patient fallen in last 6 months? No  LIVING ENVIRONMENT: Lives with: lives with their family and lives with their spouse Lives in: House/apartment Stairs: Yes: Internal: 16 steps; on left going up Has following equipment at home: Single  point cane, Walker - 2 wheeled, and Crutches  OCCUPATION: retired  PLOF: Independent  PATIENT GOALS: stronger, more mobile, more physically fit, less pain  NEXT MD VISIT: 1 month  OBJECTIVE:   DIAGNOSTIC FINDINGS: bilateral knee x-rays. The right knee shows lateral compartment advanced arthritis and grade 4 patellofemoral arthritis. The left knee shows grade 4 medial compartment arthritis and grade 4 patellofemoral arthritis   PATIENT  SURVEYS:  FOTO 39% with goal of 61% at 13 weeks  COGNITION: Overall cognitive status: Within functional limits for tasks assessed     SENSATION: WFL  EDEMA:  N/a  MUSCLE LENGTH: WFL   POSTURE:  LE windswept  left to right  PALPATION: TTP left medial knee jpint line  LOWER EXTREMITY ROM:   R ankle fixed in slight inversion.  No movement available.  Active ROM Right eval Left eval  Hip flexion wfl   Hip extension 15   Hip abduction 30   Hip adduction 10   Hip internal rotation    Hip external rotation    Knee flexion 140 120  Knee extension 0 0  Ankle dorsiflexion 3 wfl  Ankle plantarflexion 3   Ankle inversion 3   Ankle eversion -5    (Blank rows = not tested)  LOWER EXTREMITY MMT:  MMT Right eval Left eval  Hip flexion 4- 4+  Hip extension    Hip abduction 5 5  Hip adduction 4 4  Hip internal rotation    Hip external rotation    Knee flexion 4-P! 4+P!  Knee extension 4-P! 4P!  Ankle dorsiflexion  full  Ankle plantarflexion  full  Ankle inversion    Ankle eversion     (Blank rows = not tested)  LOWER EXTREMITY SPECIAL TESTS:  Knee special tests: Anterior drawer test: positive right  and Posterior drawer test: positive right  FUNCTIONAL TESTS:  5 times sit to stand: 10.70 Timed up and go (TUG): 9.70 Berg Balance Scale: 38/56   GAIT: Distance walked: 475f Assistive device utilized: None Level of assistance: Complete Independence Comments: Able to walk in stores for up past 30 mins.  Left knee valgus deformity right knee varus (wind swept). Gait antlagic Would benefit from use of cane.  Instructed Pt. She will attain and be instructed on proper use   TODAY'S TREATMENT:                                                                                                                              Pt seen for aquatic therapy today.  Treatment took place in water 3.25-4.5 ft in depth at the [facility] Temp of water was 91.  Pt  entered/exited the pool via stairs with step-to pattern with bilat rail.  * walking forward / backward without support, cues for heel strike and toe off * side stepping with UE on black barbell - knees straight, and then with increased step height * Holding wall:  high knee  marching; hip circles clockwise/ CCW; hip abdct/addct; heel/toe raises;  wall push up/off for reactive balance; hip openers x 10 each; R ankle circles * Staggered stance with kick board push/pull for core engagement, ankle stability, 2 x 10 each LE forward * TrA, lat, glute, lower trap set with kick board press (just under surface) x 3-5sec x 10   Pt requires the buoyancy and hydrostatic pressure of water for support, and to offload joints by unweighting joint load by at least 50 % in navel deep water and by at least 75-80% in chest to neck deep water.  Viscosity of the water is needed for resistance of strengthening. Water current perturbations provides challenge to standing balance requiring increased core activation.     PATIENT EDUCATION:  Education details:  Person educated: Patient Education method: Dance movement psychotherapist comprehension: verbalized understanding and returned demonstration  HOME EXERCISE PROGRAM: Access Code: [email or web address] URL: [email or web address] Date: 03/25/2022 Prepared by: Denton Meek  Exercises - Supine Quad Set  - 1 x daily - 7 x weekly - 3 sets - 10 reps - Supine Heel Slide  - 1 x daily - 7 x weekly - 3 sets - 10 reps - Supine Active Straight Leg Raise  - 1 x daily - 7 x weekly - 3 sets - 10 reps  ASSESSMENT:  CLINICAL IMPRESSION: Pt initially guarded with gait; requires some minor cues for allowing available range in Rt ankle and bilat knees.  She tolerated exercises without increase in pain.  Staggered stance is a good challenge for her balance and core. Goals are ongoing.    From EVAL:  Patient is a 77 y.o. F who was seen today for physical therapy  evaluation and treatment for bilateral OA knee. 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  She presnets today with long history of hip, knee and ankle trauma/dysfunctions with clearly a very high pain tolerance.  Pt has a significant left to right windswept deformity of knees.  Right ankle fixed in slight inversion with little mobility in any plane with her right knee compensating in (opposite) varus deformity .  Her objective test is wfl other than Berg balance which suggests she is a medium fall risk. Her gait is antalgic with > pain in right ankle and knee.  She is a good candidate for skilled physical therapy in aquatic setting (initially) using properties of water to improve strength, gait pattern balance and decrease pain. Will transition to land when appropriate.  OBJECTIVE IMPAIRMENTS: Abnormal gait, decreased balance, decreased knowledge of condition, decreased knowledge of use of DME, decreased mobility, difficulty walking, decreased ROM, decreased strength, postural dysfunction, and pain.   ACTIVITY LIMITATIONS: carrying, lifting, bending, standing, squatting, stairs, transfers, and locomotion level  PARTICIPATION LIMITATIONS: meal prep, cleaning, shopping, community activity, and yard work  PERSONAL FACTORS: Age, Behavior pattern, Fitness, and Time since onset of injury/illness/exacerbation are also affecting patient's functional outcome.   REHAB POTENTIAL: Good  CLINICAL DECISION MAKING: Evolving/moderate complexity  EVALUATION COMPLEXITY: Moderate   GOALS: Goals reviewed with patient? Yes  SHORT TERM GOALS: Target date: 04/22/22 Pt will tolerate sessions of aquatic therapy without increase in pain to demonstrate positive response to setting intervention Baseline:tba Goal status: INITIAL  2.  Pt will be able to complete SLS submerged in 3.36f up to or >20s R/L to demonstrate ability to balance with improvement Baseline: unable to complete SLS  land Goal status: INITIAL  3.  Pt will have  increase in r hip ROM to improve toleration to ambulation Baseline: see chart Goal status: INITIAL  4.  Pt will demonstrate proper use of cane and report consistent use to decrease pain and improve mobility Baseline: not using Goal status: INITIAL   LONG TERM GOALS: Target date: 05/20/22  Pt will meet foto goal of 61% to demonstrate improved perception of functional mobility Baseline: 39% Goal status: INITIAL  2.  Worst pain to be < 4/10 to demonstrate improvement in condition Baseline: 8/10 Goal status: INITIAL  3.  Pt will improve hip flex and abd strength 1 full grade to demonstrate improved strength. Baseline: see chart Goal status: INITIAL  4.  Pt will improve on Berg balance test to up to or > 50/56 to demonstrate decreased fall risk Baseline: 38/56 Goal status: INITIAL  5.  Pt will improve in bilat knee strength by 1/2 grade despite chronic limiting pain. Baseline: see chart Goal status: INITIAL  6.  Pt will be Indep with land based final HEP to manage chronic conditions going forward Baseline: unmanaged LE dysfunction Goal status: INITIAL   PLAN:  PT FREQUENCY: 1-2x/week  PT DURATION: 8 weeks  PLANNED INTERVENTIONS: Therapeutic exercises, Therapeutic activity, Neuromuscular re-education, Balance training, Gait training, Patient/Family education, Self Care, Joint mobilization, Joint manipulation, Stair training, Orthotic/Fit training, DME instructions, Aquatic Therapy, Dry Needling, Electrical stimulation, Cryotherapy, Moist heat, Compression bandaging, scar mobilization, Splintting, Taping, Ultrasound, Contrast bath, Ionotophoresis '4mg'$ /ml Dexamethasone, Manual therapy, and Re-evaluation  PLAN FOR NEXT SESSION: Aquatics for LE/core strengthening, balance and proprioceptive retraining, stair climbing, transfers, aerobic capacity. Land: strengthening, gait training, recommendations for bracing or orthotics as appropriate,  HEP  Kerin Perna, PTA 03/29/22 10:31 AM [facility] [address] [place], [place], [address] Phone: [phone]   Fax:  [phone]

## 2022-04-06 ENCOUNTER — Encounter (HOSPITAL_BASED_OUTPATIENT_CLINIC_OR_DEPARTMENT_OTHER): Payer: Self-pay | Admitting: Physical Therapy

## 2022-04-06 ENCOUNTER — Ambulatory Visit (HOSPITAL_BASED_OUTPATIENT_CLINIC_OR_DEPARTMENT_OTHER): Payer: Medicare Other | Admitting: Physical Therapy

## 2022-04-06 DIAGNOSIS — G8929 Other chronic pain: Secondary | ICD-10-CM | POA: Diagnosis not present

## 2022-04-06 DIAGNOSIS — M25562 Pain in left knee: Secondary | ICD-10-CM | POA: Diagnosis not present

## 2022-04-06 DIAGNOSIS — R262 Difficulty in walking, not elsewhere classified: Secondary | ICD-10-CM

## 2022-04-06 DIAGNOSIS — M6281 Muscle weakness (generalized): Secondary | ICD-10-CM

## 2022-04-06 DIAGNOSIS — M170 Bilateral primary osteoarthritis of knee: Secondary | ICD-10-CM | POA: Diagnosis not present

## 2022-04-06 DIAGNOSIS — M25561 Pain in right knee: Secondary | ICD-10-CM | POA: Diagnosis not present

## 2022-04-06 NOTE — Therapy (Signed)
OUTPATIENT PHYSICAL THERAPY LOWER EXTREMITY TREATMENT   Patient Name: Kelly Hancock MRN: [id number] DOB:06-25-1945, 77 y.o., female Today's Date: 04/06/2022  END OF SESSION:  PT End of Session - 04/06/22 0959     Visit Number 3    Number of Visits 16    Date for PT Re-Evaluation 05/20/22    Authorization Type BCBS mcr    Progress Note Due on Visit 10    PT Start Time [phone]    PT Stop Time 1030    PT Time Calculation (min) 44 min    Activity Tolerance Patient tolerated treatment well    Behavior During Therapy [facility] for tasks assessed/performed              Past Medical History:  Diagnosis Date   History of breast cancer 09/2015   L   Osteoarthritis    right hip   PONV (postoperative nausea and vomiting)    Past Surgical History:  Procedure Laterality Date   APPENDECTOMY     BREAST LUMPECTOMY Left 11/12/2015   BREAST LUMPECTOMY WITH RADIOACTIVE SEED AND SENTINEL LYMPH NODE BIOPSY Left 11/12/2015   Procedure: BREAST LUMPECTOMY WITH RADIOACTIVE SEED AND SENTINEL LYMPH NODE BIOPSY;  Surgeon: Excell Seltzer, MD;  Location: [place];  Service: General;  Laterality: Left;   BREAST REDUCTION SURGERY Bilateral 11/26/2015   Procedure: MAMMARY REDUCTION  (BREAST) FOR ASYMMETRY;  Surgeon: Wallace Going, DO;  Location: [place];  Service: Plastics;  Laterality: Bilateral;   LIPOSUCTION Bilateral 11/26/2015   Procedure: LIPOSUCTION;  Surgeon: Wallace Going, DO;  Location: [place];  Service: Plastics;  Laterality: Bilateral;   REDUCTION MAMMAPLASTY Bilateral 10/2015   TOTAL HIP ARTHROPLASTY Right    Patient Active Problem List   Diagnosis Date Noted   Mechanical knee pain, left 08/28/2020   Carcinoma of upper-outer quadrant of left female breast ([place]) 09/05/2015   Post-traumatic osteoarthritis of right knee 08/29/2014   Shoulder dislocation, recurrent 07/29/2010   DEGENERATIVE JOINT DISEASE, LEFT SHOULDER  03/19/2010   Osteoarthritis of ankle and foot 11/19/2009   GAD (generalized anxiety disorder) 05/12/2006   DEPRESSIVE DISORDER, NOS 05/12/2006   DIVERTICULITIS OF COLON, NOS 05/12/2006   MENOPAUSAL SYNDROME 05/12/2006    PCP: Cyndi Bender, PA-C   REFERRING PROVIDER: Stefanie Libel, MD   REFERRING DIAG:  Diagnosis  M17.11 (ICD-10-CM) - Primary osteoarthritis of right knee  M17.12 (ICD-10-CM) - Primary osteoarthritis of left knee    THERAPY DIAG:  Chronic pain of left knee  Chronic pain of right knee  Difficulty in walking, not elsewhere classified  Muscle weakness (generalized)  Rationale for Evaluation and Treatment: Rehabilitation  ONSET DATE: >10 yrs  SUBJECTIVE:   SUBJECTIVE STATEMENT: Pt reports overall slight decrease in pain left knee. She is keeping a journal   PERTINENT HISTORY: Right ankle fx ~ 15 yrs ago: Pt fell down steps reset it by herself did not seek medical treatment. It did not set properly. Birth: right hip dislocatred.  Had THR x35 yrs ago, not ever revised.  Some discomfort PAIN:  Are you having pain? Yes: NPRS scale: 4/10  Pain location: bilat knees and Right ankle Pain description: splitting pain through left knee Aggravating factors: walking Relieving factors: sitting, hot shower  PRECAUTIONS: Knee and Fall  WEIGHT BEARING RESTRICTIONS: No  FALLS:  Has patient fallen in last 6 months? No  LIVING ENVIRONMENT: Lives with: lives with their family and lives with their spouse Lives in: House/apartment Stairs: Yes: Internal:  16 steps; on left going up Has following equipment at home: Single point cane, Walker - 2 wheeled, and Crutches  OCCUPATION: retired  PLOF: Independent  PATIENT GOALS: stronger, more mobile, more physically fit, less pain  NEXT MD VISIT: 1 month  OBJECTIVE:   DIAGNOSTIC FINDINGS: bilateral knee x-rays. The right knee shows lateral compartment advanced arthritis and grade 4 patellofemoral arthritis. The left  knee shows grade 4 medial compartment arthritis and grade 4 patellofemoral arthritis   PATIENT SURVEYS:  FOTO 39% with goal of 61% at 13 weeks  COGNITION: Overall cognitive status: Within functional limits for tasks assessed     SENSATION: WFL  EDEMA:  N/a  MUSCLE LENGTH: WFL   POSTURE:  LE windswept  left to right  PALPATION: TTP left medial knee jpint line  LOWER EXTREMITY ROM:   R ankle fixed in slight inversion.  No movement available.  Active ROM Right eval Left eval  Hip flexion wfl   Hip extension 15   Hip abduction 30   Hip adduction 10   Hip internal rotation    Hip external rotation    Knee flexion 140 120  Knee extension 0 0  Ankle dorsiflexion 3 wfl  Ankle plantarflexion 3   Ankle inversion 3   Ankle eversion -5    (Blank rows = not tested)  LOWER EXTREMITY MMT:  MMT Right eval Left eval  Hip flexion 4- 4+  Hip extension    Hip abduction 5 5  Hip adduction 4 4  Hip internal rotation    Hip external rotation    Knee flexion 4-P! 4+P!  Knee extension 4-P! 4P!  Ankle dorsiflexion  full  Ankle plantarflexion  full  Ankle inversion    Ankle eversion     (Blank rows = not tested)  LOWER EXTREMITY SPECIAL TESTS:  Knee special tests: Anterior drawer test: positive right  and Posterior drawer test: positive right  FUNCTIONAL TESTS:  5 times sit to stand: 10.70 Timed up and go (TUG): 9.70 Berg Balance Scale: 38/56   GAIT: Distance walked: 448f Assistive device utilized: None Level of assistance: Complete Independence Comments: Able to walk in stores for up past 30 mins.  Left knee valgus deformity right knee varus (wind swept). Gait antlagic Would benefit from use of cane.  Instructed Pt. She will attain and be instructed on proper use   TODAY'S TREATMENT:                                                                                                                              Pt seen for aquatic therapy today.  Treatment took place  in water 3.25-4.[place] ft [place] Temp of water was 91.  Pt entered/exited the pool via stairs with step-to pattern with bilat rail.  * walking forward / backward without support, cues for heel strike and toe off * side stepping without UE support- knees straight, then side lunge  as tolerated * Holding wall:  high knee marching; minisquats Seated lift chair: add/abd 3x20 reps quick; cycling between sets: flutter kicking 3x20 quick *STS from 3 water step 2x10 * Staggered stance with kick board push/pull for core engagement, ankle stability, 2 x 15 each LE forward   Pt requires the buoyancy and hydrostatic pressure of water for support, and to offload joints by unweighting joint load by at least 50 % in navel deep water and by at least 75-80% in chest to neck deep water.  Viscosity of the water is needed for resistance of strengthening. Water current perturbations provides challenge to standing balance requiring increased core activation.     PATIENT EDUCATION:  Education details:  Person educated: Patient Education method: Dance movement psychotherapist comprehension: verbalized understanding and returned demonstration  HOME EXERCISE PROGRAM: Access Code: [email or web address] URL: [email or web address] Date: 03/25/2022 Prepared by: Denton Meek  Exercises - Supine Quad Set  - 1 x daily - 7 x weekly - 3 sets - 10 reps - Supine Heel Slide  - 1 x daily - 7 x weekly - 3 sets - 10 reps - Supine Active Straight Leg Raise  - 1 x daily - 7 x weekly - 3 sets - 10 reps  ASSESSMENT:  CLINICAL IMPRESSION: Progressed balance and strength tasks by decreasing ue support with amb and increasing speed of exercises. She does report some pain in rle ankle to knee with activities in SLS due to right ankle deformity equally as uncomfortable as left knee. She reports good response to last session stating she felt stronger afterwards no increase in pain. She  requires vc and demonstration for execution. I did modify stance position in some circumstances to accommodate right ankle.  Pt initially guarded with gait; requires some minor cues for allowing available range in Rt ankle and bilat knees.  She tolerated exercises without increase in pain.  Staggered stance is a good challenge for her balance and core. Goals are ongoing.    From EVAL:  Patient is a 77 y.o. F who was seen today for physical therapy evaluation and treatment for bilateral OA knee. 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  She presnets today with long history of hip, knee and ankle trauma/dysfunctions with clearly a very high pain tolerance.  Pt has a significant left to right windswept deformity of knees.  Right ankle fixed in slight inversion with little mobility in any plane with her right knee compensating in (opposite) varus deformity .  Her objective test is wfl other than Berg balance which suggests she is a medium fall risk. Her gait is antalgic with > pain in right ankle and knee.  She is a good candidate for skilled physical therapy in aquatic setting (initially) using properties of water to improve strength, gait pattern balance and decrease pain. Will transition to land when appropriate.  OBJECTIVE IMPAIRMENTS: Abnormal gait, decreased balance, decreased knowledge of condition, decreased knowledge of use of DME, decreased mobility, difficulty walking, decreased ROM, decreased strength, postural dysfunction, and pain.   ACTIVITY LIMITATIONS: carrying, lifting, bending, standing, squatting, stairs, transfers, and locomotion level  PARTICIPATION LIMITATIONS: meal prep, cleaning, shopping, community activity, and yard work  PERSONAL FACTORS: Age, Behavior pattern, Fitness, and Time since onset of injury/illness/exacerbation are also affecting patient's functional outcome.   REHAB POTENTIAL: Good  CLINICAL DECISION MAKING: Evolving/moderate  complexity  EVALUATION COMPLEXITY: Moderate   GOALS: Goals reviewed with patient? Yes  SHORT TERM GOALS:  Target date: 04/22/22 Pt will tolerate sessions of aquatic therapy without increase in pain to demonstrate positive response to setting intervention Baseline:tba Goal status: INITIAL  2.  Pt will be able to complete SLS submerged in 3.69f up to or >20s R/L to demonstrate ability to balance with improvement Baseline: unable to complete SLS land Goal status: INITIAL  3.  Pt will have increase in r hip ROM to improve toleration to ambulation Baseline: see chart Goal status: INITIAL  4.  Pt will demonstrate proper use of cane and report consistent use to decrease pain and improve mobility Baseline: not using Goal status: INITIAL   LONG TERM GOALS: Target date: 05/20/22  Pt will meet foto goal of 61% to demonstrate improved perception of functional mobility Baseline: 39% Goal status: INITIAL  2.  Worst pain to be < 4/10 to demonstrate improvement in condition Baseline: 8/10 Goal status: INITIAL  3.  Pt will improve hip flex and abd strength 1 full grade to demonstrate improved strength. Baseline: see chart Goal status: INITIAL  4.  Pt will improve on Berg balance test to up to or > 50/56 to demonstrate decreased fall risk Baseline: 38/56 Goal status: INITIAL  5.  Pt will improve in bilat knee strength by 1/2 grade despite chronic limiting pain. Baseline: see chart Goal status: INITIAL  6.  Pt will be Indep with land based final HEP to manage chronic conditions going forward Baseline: unmanaged LE dysfunction Goal status: INITIAL   PLAN:  PT FREQUENCY: 1-2x/week  PT DURATION: 8 weeks  PLANNED INTERVENTIONS: Therapeutic exercises, Therapeutic activity, Neuromuscular re-education, Balance training, Gait training, Patient/Family education, Self Care, Joint mobilization, Joint manipulation, Stair training, Orthotic/Fit training, DME instructions, Aquatic Therapy, Dry  Needling, Electrical stimulation, Cryotherapy, Moist heat, Compression bandaging, scar mobilization, Splintting, Taping, Ultrasound, Contrast bath, Ionotophoresis '4mg'$ /ml Dexamethasone, Manual therapy, and Re-evaluation  PLAN FOR NEXT SESSION: Aquatics for LE/core strengthening, balance and proprioceptive retraining, stair climbing, transfers, aerobic capacity. Land: strengthening, gait training, recommendations for bracing or orthotics as appropriate, HEP  MNoelani Harbach Marckus Hanover MPT 04/06/22 1[facility] [address]afl[address] [place] [address]Phone: [phone]  Fax:  [phone]

## 2022-04-09 ENCOUNTER — Ambulatory Visit (HOSPITAL_BASED_OUTPATIENT_CLINIC_OR_DEPARTMENT_OTHER): Payer: Medicare Other | Admitting: Physical Therapy

## 2022-04-09 ENCOUNTER — Encounter (HOSPITAL_BASED_OUTPATIENT_CLINIC_OR_DEPARTMENT_OTHER): Payer: Self-pay | Admitting: Physical Therapy

## 2022-04-09 DIAGNOSIS — G8929 Other chronic pain: Secondary | ICD-10-CM | POA: Diagnosis not present

## 2022-04-09 DIAGNOSIS — M25561 Pain in right knee: Secondary | ICD-10-CM | POA: Diagnosis not present

## 2022-04-09 DIAGNOSIS — R262 Difficulty in walking, not elsewhere classified: Secondary | ICD-10-CM

## 2022-04-09 DIAGNOSIS — M170 Bilateral primary osteoarthritis of knee: Secondary | ICD-10-CM | POA: Diagnosis not present

## 2022-04-09 DIAGNOSIS — M6281 Muscle weakness (generalized): Secondary | ICD-10-CM | POA: Diagnosis not present

## 2022-04-09 DIAGNOSIS — M25562 Pain in left knee: Secondary | ICD-10-CM | POA: Diagnosis not present

## 2022-04-09 NOTE — Therapy (Signed)
OUTPATIENT PHYSICAL THERAPY LOWER EXTREMITY TREATMENT   Patient Name: Kelly Hancock MRN: [id number] DOB:1945/06/25, 77 y.o., female Today's Date: 04/09/2022  END OF SESSION:  PT End of Session - 04/09/22 1321     Visit Number 4    Number of Visits 16    Date for PT Re-Evaluation 05/20/22    Authorization Type BCBS MCR    Progress Note Due on Visit 10    PT Start Time 1300    PT Stop Time 1340    PT Time Calculation (min) 40 min    Activity Tolerance Patient tolerated treatment well    Behavior During Therapy [facility] for tasks assessed/performed              Past Medical History:  Diagnosis Date   History of breast cancer 09/2015   L   Osteoarthritis    right hip   PONV (postoperative nausea and vomiting)    Past Surgical History:  Procedure Laterality Date   APPENDECTOMY     BREAST LUMPECTOMY Left 11/12/2015   BREAST LUMPECTOMY WITH RADIOACTIVE SEED AND SENTINEL LYMPH NODE BIOPSY Left 11/12/2015   Procedure: BREAST LUMPECTOMY WITH RADIOACTIVE SEED AND SENTINEL LYMPH NODE BIOPSY;  Surgeon: Excell Seltzer, MD;  Location: [place];  Service: General;  Laterality: Left;   BREAST REDUCTION SURGERY Bilateral 11/26/2015   Procedure: MAMMARY REDUCTION  (BREAST) FOR ASYMMETRY;  Surgeon: Wallace Going, DO;  Location: [place];  Service: Plastics;  Laterality: Bilateral;   LIPOSUCTION Bilateral 11/26/2015   Procedure: LIPOSUCTION;  Surgeon: Wallace Going, DO;  Location: [place];  Service: Plastics;  Laterality: Bilateral;   REDUCTION MAMMAPLASTY Bilateral 10/2015   TOTAL HIP ARTHROPLASTY Right    Patient Active Problem List   Diagnosis Date Noted   Mechanical knee pain, left 08/28/2020   Carcinoma of upper-outer quadrant of left female breast ([place]) 09/05/2015   Post-traumatic osteoarthritis of right knee 08/29/2014   Shoulder dislocation, recurrent 07/29/2010   DEGENERATIVE JOINT DISEASE, LEFT SHOULDER  03/19/2010   Osteoarthritis of ankle and foot 11/19/2009   GAD (generalized anxiety disorder) 05/12/2006   DEPRESSIVE DISORDER, NOS 05/12/2006   DIVERTICULITIS OF COLON, NOS 05/12/2006   MENOPAUSAL SYNDROME 05/12/2006    PCP: Cyndi Bender, PA-C   REFERRING PROVIDER: Stefanie Libel, MD   REFERRING DIAG:  Diagnosis  M17.11 (ICD-10-CM) - Primary osteoarthritis of right knee  M17.12 (ICD-10-CM) - Primary osteoarthritis of left knee    THERAPY DIAG:  Chronic pain of left knee  Chronic pain of right knee  Difficulty in walking, not elsewhere classified  Muscle weakness (generalized)  Rationale for Evaluation and Treatment: Rehabilitation  ONSET DATE: >10 yrs  SUBJECTIVE:   SUBJECTIVE STATEMENT: Pt reports she was tired after last session, "I was asleep by 5pm"  "I feel like I'm getting stronger".    PERTINENT HISTORY: Right ankle fx ~ 15 yrs ago: Pt fell down steps reset it by herself did not seek medical treatment. It did not set properly. Birth: right hip dislocatred.  Had THR x35 yrs ago, not ever revised.  Some discomfort PAIN:  Are you having pain? Yes: NPRS scale: 4/10  Pain location: bilat knees and Right ankle Pain description: splitting pain through left knee Aggravating factors: walking Relieving factors: sitting, hot shower  PRECAUTIONS: Knee and Fall  WEIGHT BEARING RESTRICTIONS: No  FALLS:  Has patient fallen in last 6 months? No  LIVING ENVIRONMENT: Lives with: lives with their family and lives with their  spouse Lives in: House/apartment Stairs: Yes: Internal: 16 steps; on left going up Has following equipment at home: Single point cane, Walker - 2 wheeled, and Crutches  OCCUPATION: retired  PLOF: Independent  PATIENT GOALS: stronger, more mobile, more physically fit, less pain  NEXT MD VISIT: 1 month  OBJECTIVE:   DIAGNOSTIC FINDINGS: bilateral knee x-rays. The right knee shows lateral compartment advanced arthritis and grade 4  patellofemoral arthritis. The left knee shows grade 4 medial compartment arthritis and grade 4 patellofemoral arthritis   PATIENT SURVEYS:  FOTO 39% with goal of 61% at 13 weeks  COGNITION: Overall cognitive status: Within functional limits for tasks assessed     SENSATION: WFL  EDEMA:  N/a  MUSCLE LENGTH: WFL   POSTURE:  LE windswept  left to right  PALPATION: TTP left medial knee jpint line  LOWER EXTREMITY ROM:   R ankle fixed in slight inversion.  No movement available.  Active ROM Right eval Left eval  Hip flexion wfl   Hip extension 15   Hip abduction 30   Hip adduction 10   Hip internal rotation    Hip external rotation    Knee flexion 140 120  Knee extension 0 0  Ankle dorsiflexion 3 wfl  Ankle plantarflexion 3   Ankle inversion 3   Ankle eversion -5    (Blank rows = not tested)  LOWER EXTREMITY MMT:  MMT Right eval Left eval  Hip flexion 4- 4+  Hip extension    Hip abduction 5 5  Hip adduction 4 4  Hip internal rotation    Hip external rotation    Knee flexion 4-P! 4+P!  Knee extension 4-P! 4P!  Ankle dorsiflexion  full  Ankle plantarflexion  full  Ankle inversion    Ankle eversion     (Blank rows = not tested)  LOWER EXTREMITY SPECIAL TESTS:  Knee special tests: Anterior drawer test: positive right  and Posterior drawer test: positive right  FUNCTIONAL TESTS:  5 times sit to stand: 10.70 Timed up and go (TUG): 9.70 Berg Balance Scale: 38/56   GAIT: Distance walked: 466f Assistive device utilized: None Level of assistance: Complete Independence Comments: Able to walk in stores for up past 30 mins.  Left knee valgus deformity right knee varus (wind swept). Gait antlagic Would benefit from use of cane.  Instructed Pt. She will attain and be instructed on proper use   TODAY'S TREATMENT:                                                                                                                              Pt seen for aquatic  therapy today.  Treatment took place in water 3.25[place]4.[place] Temp of water was 91.  Pt entered/exited the pool via stairs with step-to pattern with bilat rail.  * walking forward / backward with UE support on barbell  (feels best facing hot tub), cues for  heel strike and toe off * side stepping without UE support- knees straight * Staggered stance with kick board push/pull for core engagement, ankle stability, 2 x 15 each LE forward * Holding wall:  high knee marching with gentle feet return to ground; minisquats * Seated on bench: cycling, hip abdct/ add, flutter kick, alternating LAQ with DF - 3 rounds  *STS from bench with blue step under feet with 4 sec eccenctric lowering x10 * seated on bench, TrA set with short blue noodle pull down with focus on vertical trunk Pt requires the buoyancy and hydrostatic pressure of water for support, and to offload joints by unweighting joint load by at least 50 % in navel deep water and by at least 75-80% in chest to neck deep water.  Viscosity of the water is needed for resistance of strengthening. Water current perturbations provides challenge to standing balance requiring increased core activation.     PATIENT EDUCATION:  Education details: aquatic progressions and modifications Person educated: Patient Education method: Customer service manager Education comprehension: verbalized understanding and returned demonstration  HOME EXERCISE PROGRAM: Access Code: [email or web address] URL: [email or web address] Date: 03/25/2022 Prepared by: Denton Meek  Exercises - Supine Quad Set  - 1 x daily - 7 x weekly - 3 sets - 10 reps - Supine Heel Slide  - 1 x daily - 7 x weekly - 3 sets - 10 reps - Supine Active Straight Leg Raise  - 1 x daily - 7 x weekly - 3 sets - 10 reps  ASSESSMENT:  CLINICAL IMPRESSION: Pt with increased pain with gait in water, regardless of direction.  Tolerated seated exercises  well and modified standing exercises as able.  She enjoyed the seated TrA set and STS at bench.  Goals are ongoing.    From EVAL:  Patient is a 77 y.o. F who was seen today for physical therapy evaluation and treatment for bilateral OA knee. 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  She presnets today with long history of hip, knee and ankle trauma/dysfunctions with clearly a very high pain tolerance.  Pt has a significant left to right windswept deformity of knees.  Right ankle fixed in slight inversion with little mobility in any plane with her right knee compensating in (opposite) varus deformity .  Her objective test is wfl other than Berg balance which suggests she is a medium fall risk. Her gait is antalgic with > pain in right ankle and knee.  She is a good candidate for skilled physical therapy in aquatic setting (initially) using properties of water to improve strength, gait pattern balance and decrease pain. Will transition to land when appropriate.  OBJECTIVE IMPAIRMENTS: Abnormal gait, decreased balance, decreased knowledge of condition, decreased knowledge of use of DME, decreased mobility, difficulty walking, decreased ROM, decreased strength, postural dysfunction, and pain.   ACTIVITY LIMITATIONS: carrying, lifting, bending, standing, squatting, stairs, transfers, and locomotion level  PARTICIPATION LIMITATIONS: meal prep, cleaning, shopping, community activity, and yard work  PERSONAL FACTORS: Age, Behavior pattern, Fitness, and Time since onset of injury/illness/exacerbation are also affecting patient's functional outcome.   REHAB POTENTIAL: Good  CLINICAL DECISION MAKING: Evolving/moderate complexity  EVALUATION COMPLEXITY: Moderate   GOALS: Goals reviewed with patient? Yes  SHORT TERM GOALS: Target date: 04/22/22 Pt will tolerate sessions of aquatic therapy without increase in pain to demonstrate positive response to setting  intervention Baseline:tba Goal status: INITIAL  2.  Pt will be able to complete SLS  submerged in 3.48f up to or >20s R/L to demonstrate ability to balance with improvement Baseline: unable to complete SLS land Goal status: INITIAL  3.  Pt will have increase in r hip ROM to improve toleration to ambulation Baseline: see chart Goal status: INITIAL  4.  Pt will demonstrate proper use of cane and report consistent use to decrease pain and improve mobility Baseline: not using Goal status: INITIAL   LONG TERM GOALS: Target date: 05/20/22  Pt will meet foto goal of 61% to demonstrate improved perception of functional mobility Baseline: 39% Goal status: INITIAL  2.  Worst pain to be < 4/10 to demonstrate improvement in condition Baseline: 8/10 Goal status: INITIAL  3.  Pt will improve hip flex and abd strength 1 full grade to demonstrate improved strength. Baseline: see chart Goal status: INITIAL  4.  Pt will improve on Berg balance test to up to or > 50/56 to demonstrate decreased fall risk Baseline: 38/56 Goal status: INITIAL  5.  Pt will improve in bilat knee strength by 1/2 grade despite chronic limiting pain. Baseline: see chart Goal status: INITIAL  6.  Pt will be Indep with land based final HEP to manage chronic conditions going forward Baseline: unmanaged LE dysfunction Goal status: INITIAL   PLAN:  PT FREQUENCY: 1-2x/week  PT DURATION: 8 weeks  PLANNED INTERVENTIONS: Therapeutic exercises, Therapeutic activity, Neuromuscular re-education, Balance training, Gait training, Patient/Family education, Self Care, Joint mobilization, Joint manipulation, Stair training, Orthotic/Fit training, DME instructions, Aquatic Therapy, Dry Needling, Electrical stimulation, Cryotherapy, Moist heat, Compression bandaging, scar mobilization, Splintting, Taping, Ultrasound, Contrast bath, Ionotophoresis '4mg'$ /ml Dexamethasone, Manual therapy, and Re-evaluation  PLAN FOR NEXT SESSION:  Aquatics for LE/core strengthening, balance and proprioceptive retraining, stair climbing, transfers, aerobic capacity. Land: strengthening, gait training, recommendations for bracing or orthotics as appropriate, HEP  JKerin Perna PTA 04/09/22 5:24 PM CSt. [facility] [address] [address]Phone: [phone]  Fax:  [phone]

## 2022-04-12 ENCOUNTER — Ambulatory Visit (HOSPITAL_BASED_OUTPATIENT_CLINIC_OR_DEPARTMENT_OTHER): Payer: Medicare Other | Admitting: Physical Therapy

## 2022-04-12 ENCOUNTER — Encounter (HOSPITAL_BASED_OUTPATIENT_CLINIC_OR_DEPARTMENT_OTHER): Payer: Self-pay | Admitting: Physical Therapy

## 2022-04-12 DIAGNOSIS — G8929 Other chronic pain: Secondary | ICD-10-CM | POA: Diagnosis not present

## 2022-04-12 DIAGNOSIS — M170 Bilateral primary osteoarthritis of knee: Secondary | ICD-10-CM | POA: Diagnosis not present

## 2022-04-12 DIAGNOSIS — M25561 Pain in right knee: Secondary | ICD-10-CM | POA: Diagnosis not present

## 2022-04-12 DIAGNOSIS — M25562 Pain in left knee: Secondary | ICD-10-CM | POA: Diagnosis not present

## 2022-04-12 DIAGNOSIS — M6281 Muscle weakness (generalized): Secondary | ICD-10-CM | POA: Diagnosis not present

## 2022-04-12 DIAGNOSIS — R262 Difficulty in walking, not elsewhere classified: Secondary | ICD-10-CM | POA: Diagnosis not present

## 2022-04-12 NOTE — Therapy (Signed)
OUTPATIENT PHYSICAL THERAPY LOWER EXTREMITY TREATMENT   Patient Name: Kelly Hancock MRN: [id number] DOB:08-03-1945, 77 y.o., female Today's Date: 04/12/2022  END OF SESSION:  PT End of Session - 04/12/22 0939     Visit Number 5    Number of Visits 16    Date for PT Re-Evaluation 05/20/22    Authorization Type [employer] MCR    Progress Note Due on Visit 10    PT Start Time 0940    PT Stop Time 1025    PT Time Calculation (min) 45 min    Activity Tolerance Patient tolerated treatment well    Behavior During Therapy [facility] for tasks assessed/performed              Past Medical History:  Diagnosis Date   History of breast cancer 09/2015   L   Osteoarthritis    right hip   PONV (postoperative nausea and vomiting)    Past Surgical History:  Procedure Laterality Date   APPENDECTOMY     BREAST LUMPECTOMY Left 11/12/2015   BREAST LUMPECTOMY WITH RADIOACTIVE SEED AND SENTINEL LYMPH NODE BIOPSY Left 11/12/2015   Procedure: BREAST LUMPECTOMY WITH RADIOACTIVE SEED AND SENTINEL LYMPH NODE BIOPSY;  Surgeon: Excell Seltzer, MD;  Location: [place];  Service: General;  Laterality: Left;   BREAST REDUCTION SURGERY Bilateral 11/26/2015   Procedure: MAMMARY REDUCTION  (BREAST) FOR ASYMMETRY;  Surgeon: Wallace Going, DO;  Location: [place];  Service: Plastics;  Laterality: Bilateral;   LIPOSUCTION Bilateral 11/26/2015   Procedure: LIPOSUCTION;  Surgeon: Wallace Going, DO;  Location: [place];  Service: Plastics;  Laterality: Bilateral;   REDUCTION MAMMAPLASTY Bilateral 10/2015   TOTAL HIP ARTHROPLASTY Right    Patient Active Problem List   Diagnosis Date Noted   Mechanical knee pain, left 08/28/2020   Carcinoma of upper-outer quadrant of left female breast ([place]) 09/05/2015   Post-traumatic osteoarthritis of right knee 08/29/2014   Shoulder dislocation, recurrent 07/29/2010   DEGENERATIVE JOINT DISEASE, LEFT SHOULDER  03/19/2010   Osteoarthritis of ankle and foot 11/19/2009   GAD (generalized anxiety disorder) 05/12/2006   DEPRESSIVE DISORDER, NOS 05/12/2006   DIVERTICULITIS OF COLON, NOS 05/12/2006   MENOPAUSAL SYNDROME 05/12/2006    PCP: Cyndi Bender, PA-C   REFERRING PROVIDER: Stefanie Libel, MD   REFERRING DIAG:  Diagnosis  M17.11 (ICD-10-CM) - Primary osteoarthritis of right knee  M17.12 (ICD-10-CM) - Primary osteoarthritis of left knee    THERAPY DIAG:  Chronic pain of left knee  Chronic pain of right knee  Difficulty in walking, not elsewhere classified  Muscle weakness (generalized)  Rationale for Evaluation and Treatment: Rehabilitation  ONSET DATE: >10 yrs  SUBJECTIVE:   SUBJECTIVE STATEMENT: Pt reports she was tired after last session, but no increase in pain.  She reports she is still recovering from covid, "fatigue-wise".  She rested yesterday and feels pretty good today.    PERTINENT HISTORY: Right ankle fx ~ 15 yrs ago: Pt fell down steps reset it by herself did not seek medical treatment. It did not set properly. Birth: right hip dislocatred.  Had THR x35 yrs ago, not ever revised.  Some discomfort PAIN:  Are you having pain? Yes: NPRS scale: 2/10  Pain location: Left knees and Right ankle Pain description: splitting pain through left knee Aggravating factors: walking Relieving factors: sitting, hot shower  PRECAUTIONS: Knee and Fall  WEIGHT BEARING RESTRICTIONS: No  FALLS:  Has patient fallen in last 6 months? No  LIVING ENVIRONMENT: Lives with: lives with their family and lives with their spouse Lives in: House/apartment Stairs: Yes: Internal: 16 steps; on left going up Has following equipment at home: Single point cane, Walker - 2 wheeled, and Crutches  OCCUPATION: retired  PLOF: Independent  PATIENT GOALS: stronger, more mobile, more physically fit, less pain  NEXT MD VISIT: 1 month  OBJECTIVE:   DIAGNOSTIC FINDINGS: bilateral knee x-rays.  The right knee shows lateral compartment advanced arthritis and grade 4 patellofemoral arthritis. The left knee shows grade 4 medial compartment arthritis and grade 4 patellofemoral arthritis   PATIENT SURVEYS:  FOTO 39% with goal of 61% at 13 weeks  COGNITION: Overall cognitive status: Within functional limits for tasks assessed     SENSATION: WFL  EDEMA:  N/a  MUSCLE LENGTH: WFL   POSTURE:  LE windswept  left to right  PALPATION: TTP left medial knee jpint line  LOWER EXTREMITY ROM:   R ankle fixed in slight inversion.  No movement available.  Active ROM Right eval Left eval  Hip flexion wfl   Hip extension 15   Hip abduction 30   Hip adduction 10   Hip internal rotation    Hip external rotation    Knee flexion 140 120  Knee extension 0 0  Ankle dorsiflexion 3 wfl  Ankle plantarflexion 3   Ankle inversion 3   Ankle eversion -5    (Blank rows = not tested)  LOWER EXTREMITY MMT:  MMT Right eval Left eval  Hip flexion 4- 4+  Hip extension    Hip abduction 5 5  Hip adduction 4 4  Hip internal rotation    Hip external rotation    Knee flexion 4-P! 4+P!  Knee extension 4-P! 4P!  Ankle dorsiflexion  full  Ankle plantarflexion  full  Ankle inversion    Ankle eversion     (Blank rows = not tested)  LOWER EXTREMITY SPECIAL TESTS:  Knee special tests: Anterior drawer test: positive right  and Posterior drawer test: positive right  FUNCTIONAL TESTS:  5 times sit to stand: 10.70 Timed up and go (TUG): 9.70 Berg Balance Scale: 38/56   GAIT: Distance walked: 49f Assistive device utilized: None Level of assistance: Complete Independence Comments: Able to walk in stores for up past 30 mins.  Left knee valgus deformity right knee varus (wind swept). Gait antlagic Would benefit from use of cane.  Instructed Pt. She will attain and be instructed on proper use   TODAY'S TREATMENT:                                                                                                                               Pt seen for aquatic therapy today.  Treatment took place in [place] 3.25-4.[place] of [place] 91.  Pt entered/exited the pool via stairs with step-to pattern with bilat rail.  * walking forward / backward and  side stepping without UE support on barbell (feels best facing hot tub)  * TrA set with short blue noodle pull down to thighs, slow eccentric return to surface * holding yellow noodle:  single leg clams x 10 each with cues for upright trunk with core engaged * Staggered stance with kick board push/pull for core engagement, ankle stability,  x 15 each LE forward, repeated with increased speed and standard shoulder width stance * Holding single yellow hand float at side - walking forward/backward (limited tolerance) * Holding wall:  high knee marching with gentle feet return to ground; minisquats * Seated on bench: cycling, hip abdct/ add, flutter kick, alternating LAQ with DF - 2 rounds;  TrA set with short blue noodle pull down while sitting  * STS from bench with blue step under feet with 4 sec eccenctric lowering x5 (Rt foot slightly forward for improved tolerance) * SLS with toe taps to step in water, without UE support * straddling yellow noodle and cycling with LE suspended   Pt requires the buoyancy and hydrostatic pressure of water for support, and to offload joints by unweighting joint load by at least 50 % in navel deep water and by at least 75-80% in chest to neck deep water.  Viscosity of the water is needed for resistance of strengthening. Water current perturbations provides challenge to standing balance requiring increased core activation.     PATIENT EDUCATION:  Education details: aquatic progressions and modifications Person educated: Patient Education method: Customer service manager Education comprehension: verbalized understanding and returned demonstration  HOME  EXERCISE PROGRAM: Access Code: [email or web address] URL: [email or web address] Date: 03/25/2022 Prepared by: Denton Meek  Exercises - Supine Quad Set  - 1 x daily - 7 x weekly - 3 sets - 10 reps - Supine Heel Slide  - 1 x daily - 7 x weekly - 3 sets - 10 reps - Supine Active Straight Leg Raise  - 1 x daily - 7 x weekly - 3 sets - 10 reps  ASSESSMENT:  CLINICAL IMPRESSION: Pt with improved tolerance for gait in water at beginning of session. She reported increased Rt ankle pain with added hand float under water as well as the STS (due to increased Rt ankle DF with knee flexion).   She enjoyed the seated TrA set and STS at bench. Pain in ankle decreased with suspended cycling.  Progressing gradually towards goals.     From EVAL:  Patient is a 77 y.o. F who was seen today for physical therapy evaluation and treatment for bilateral OA knee. 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  She presnets today with long history of hip, knee and ankle trauma/dysfunctions with clearly a very high pain tolerance.  Pt has a significant left to right windswept deformity of knees.  Right ankle fixed in slight inversion with little mobility in any plane with her right knee compensating in (opposite) varus deformity .  Her objective test is wfl other than Berg balance which suggests she is a medium fall risk. Her gait is antalgic with > pain in right ankle and knee.  She is a good candidate for skilled physical therapy in aquatic setting (initially) using properties of water to improve strength, gait pattern balance and decrease pain. Will transition to land when appropriate.  OBJECTIVE IMPAIRMENTS: Abnormal gait, decreased balance, decreased knowledge of condition, decreased knowledge of use of DME, decreased mobility, difficulty walking, decreased ROM, decreased strength, postural dysfunction, and pain.  ACTIVITY LIMITATIONS: carrying, lifting, bending, standing, squatting,  stairs, transfers, and locomotion level  PARTICIPATION LIMITATIONS: meal prep, cleaning, shopping, community activity, and yard work  PERSONAL FACTORS: Age, Behavior pattern, Fitness, and Time since onset of injury/illness/exacerbation are also affecting patient's functional outcome.   REHAB POTENTIAL: Good  CLINICAL DECISION MAKING: Evolving/moderate complexity  EVALUATION COMPLEXITY: Moderate   GOALS: Goals reviewed with patient? Yes  SHORT TERM GOALS: Target date: 04/22/22 Pt will tolerate sessions of aquatic therapy without increase in pain to demonstrate positive response to setting intervention Baseline:tba Goal status: INITIAL  2.  Pt will be able to complete SLS submerged in 3.47f up to or >20s R/L to demonstrate ability to balance with improvement Baseline: unable to complete SLS land Goal status: INITIAL  3.  Pt will have increase in r hip ROM to improve toleration to ambulation Baseline: see chart Goal status: INITIAL  4.  Pt will demonstrate proper use of cane and report consistent use to decrease pain and improve mobility Baseline: not using Goal status: INITIAL   LONG TERM GOALS: Target date: 05/20/22  Pt will meet foto goal of 61% to demonstrate improved perception of functional mobility Baseline: 39% Goal status: INITIAL  2.  Worst pain to be < 4/10 to demonstrate improvement in condition Baseline: 8/10 Goal status: INITIAL  3.  Pt will improve hip flex and abd strength 1 full grade to demonstrate improved strength. Baseline: see chart Goal status: INITIAL  4.  Pt will improve on Berg balance test to up to or > 50/56 to demonstrate decreased fall risk Baseline: 38/56 Goal status: INITIAL  5.  Pt will improve in bilat knee strength by 1/2 grade despite chronic limiting pain. Baseline: see chart Goal status: INITIAL  6.  Pt will be Indep with land based final HEP to manage chronic conditions going forward Baseline: unmanaged LE dysfunction Goal  status: INITIAL   PLAN:  PT FREQUENCY: 1-2x/week  PT DURATION: 8 weeks  PLANNED INTERVENTIONS: Therapeutic exercises, Therapeutic activity, Neuromuscular re-education, Balance training, Gait training, Patient/Family education, Self Care, Joint mobilization, Joint manipulation, Stair training, Orthotic/Fit training, DME instructions, Aquatic Therapy, Dry Needling, Electrical stimulation, Cryotherapy, Moist heat, Compression bandaging, scar mobilization, Splintting, Taping, Ultrasound, Contrast bath, Ionotophoresis '4mg'$ /ml Dexamethasone, Manual therapy, and Re-evaluation  PLAN FOR NEXT SESSION: Aquatics for LE/core strengthening, balance and proprioceptive retraining, stair climbing, transfers, aerobic capacity. Land: strengthening, gait training, recommendations for bracing or orthotics as appropriate, HEP  JKerin Perna PTA 04/12/22 10:28 AM [facility] [address]GB[address]ch [address] N[place] [address]Phone: [phone]  Fax:  [phone]

## 2022-04-14 ENCOUNTER — Ambulatory Visit (HOSPITAL_BASED_OUTPATIENT_CLINIC_OR_DEPARTMENT_OTHER): Payer: Medicare Other | Admitting: Physical Therapy

## 2022-04-14 ENCOUNTER — Encounter (HOSPITAL_BASED_OUTPATIENT_CLINIC_OR_DEPARTMENT_OTHER): Payer: Self-pay | Admitting: Physical Therapy

## 2022-04-14 DIAGNOSIS — M6281 Muscle weakness (generalized): Secondary | ICD-10-CM

## 2022-04-14 DIAGNOSIS — R262 Difficulty in walking, not elsewhere classified: Secondary | ICD-10-CM | POA: Diagnosis not present

## 2022-04-14 DIAGNOSIS — M25561 Pain in right knee: Secondary | ICD-10-CM | POA: Diagnosis not present

## 2022-04-14 DIAGNOSIS — G8929 Other chronic pain: Secondary | ICD-10-CM

## 2022-04-14 DIAGNOSIS — M25562 Pain in left knee: Secondary | ICD-10-CM | POA: Diagnosis not present

## 2022-04-14 DIAGNOSIS — M170 Bilateral primary osteoarthritis of knee: Secondary | ICD-10-CM | POA: Diagnosis not present

## 2022-04-14 NOTE — Therapy (Signed)
OUTPATIENT PHYSICAL THERAPY LOWER EXTREMITY TREATMENT   Patient Name: Myrle Wanek MRN: [id number] DOB:1946-02-02, 77 y.o., female Today's Date: 04/14/2022  END OF SESSION:  PT End of Session - 04/14/22 1117     Visit Number 6    Number of Visits 16    Date for PT Re-Evaluation 05/20/22    Authorization Type BCBS mdcr    PT Start Time 1117    PT Stop Time 1200    PT Time Calculation (min) 43 min    Activity Tolerance Patient tolerated treatment well    Behavior During Therapy [facility] for tasks assessed/performed              Past Medical History:  Diagnosis Date   History of breast cancer 09/2015   L   Osteoarthritis    right hip   PONV (postoperative nausea and vomiting)    Past Surgical History:  Procedure Laterality Date   APPENDECTOMY     BREAST LUMPECTOMY Left 11/12/2015   BREAST LUMPECTOMY WITH RADIOACTIVE SEED AND SENTINEL LYMPH NODE BIOPSY Left 11/12/2015   Procedure: BREAST LUMPECTOMY WITH RADIOACTIVE SEED AND SENTINEL LYMPH NODE BIOPSY;  Surgeon: Excell Seltzer, MD;  Location: [place];  Service: General;  Laterality: Left;   BREAST REDUCTION SURGERY Bilateral 11/26/2015   Procedure: MAMMARY REDUCTION  (BREAST) FOR ASYMMETRY;  Surgeon: Wallace Going, DO;  Location: [place];  Service: Plastics;  Laterality: Bilateral;   LIPOSUCTION Bilateral 11/26/2015   Procedure: LIPOSUCTION;  Surgeon: Wallace Going, DO;  Location: [place];  Service: Plastics;  Laterality: Bilateral;   REDUCTION MAMMAPLASTY Bilateral 10/2015   TOTAL HIP ARTHROPLASTY Right    Patient Active Problem List   Diagnosis Date Noted   Mechanical knee pain, left 08/28/2020   Carcinoma of upper-outer quadrant of left female breast ([place]) 09/05/2015   Post-traumatic osteoarthritis of right knee 08/29/2014   Shoulder dislocation, recurrent 07/29/2010   DEGENERATIVE JOINT DISEASE, LEFT SHOULDER 03/19/2010   Osteoarthritis of ankle and  foot 11/19/2009   GAD (generalized anxiety disorder) 05/12/2006   DEPRESSIVE DISORDER, NOS 05/12/2006   DIVERTICULITIS OF COLON, NOS 05/12/2006   MENOPAUSAL SYNDROME 05/12/2006    PCP: Cyndi Bender, PA-C   REFERRING PROVIDER: Stefanie Libel, MD   REFERRING DIAG:  Diagnosis  M17.11 (ICD-10-CM) - Primary osteoarthritis of right knee  M17.12 (ICD-10-CM) - Primary osteoarthritis of left knee    THERAPY DIAG:  Chronic pain of left knee  Chronic pain of right knee  Difficulty in walking, not elsewhere classified  Muscle weakness (generalized)  Rationale for Evaluation and Treatment: Rehabilitation  ONSET DATE: >10 yrs  SUBJECTIVE:   SUBJECTIVE STATEMENT: Pt reports decreased pain overall, "had a time when neither my foot/ankle or knee was hurting."   PERTINENT HISTORY: Right ankle fx ~ 15 yrs ago: Pt fell down steps reset it by herself did not seek medical treatment. It did not set properly. Birth: right hip dislocatred.  Had THR x35 yrs ago, not ever revised.  Some discomfort PAIN:  Are you having pain? Yes: NPRS scale: 2/10  Pain location: Left knees and Right ankle Pain description: splitting pain through left knee Aggravating factors: walking Relieving factors: sitting, hot shower  PRECAUTIONS: Knee and Fall  WEIGHT BEARING RESTRICTIONS: No  FALLS:  Has patient fallen in last 6 months? No  LIVING ENVIRONMENT: Lives with: lives with their family and lives with their spouse Lives in: House/apartment Stairs: Yes: Internal: 16 steps; on left going up Has  following equipment at home: Single point cane, Walker - 2 wheeled, and Crutches  OCCUPATION: retired  PLOF: Independent  PATIENT GOALS: stronger, more mobile, more physically fit, less pain  NEXT MD VISIT: 1 month  OBJECTIVE:   DIAGNOSTIC FINDINGS: bilateral knee x-rays. The right knee shows lateral compartment advanced arthritis and grade 4 patellofemoral arthritis. The left knee shows grade 4 medial  compartment arthritis and grade 4 patellofemoral arthritis   PATIENT SURVEYS:  FOTO 39% with goal of 61% at 13 weeks  COGNITION: Overall cognitive status: Within functional limits for tasks assessed     SENSATION: WFL  EDEMA:  N/a  MUSCLE LENGTH: WFL   POSTURE:  LE windswept  left to right  PALPATION: TTP left medial knee jpint line  LOWER EXTREMITY ROM:   R ankle fixed in slight inversion.  No movement available.  Active ROM Right eval Left eval  Hip flexion wfl   Hip extension 15   Hip abduction 30   Hip adduction 10   Hip internal rotation    Hip external rotation    Knee flexion 140 120  Knee extension 0 0  Ankle dorsiflexion 3 wfl  Ankle plantarflexion 3   Ankle inversion 3   Ankle eversion -5    (Blank rows = not tested)  LOWER EXTREMITY MMT:  MMT Right eval Left eval  Hip flexion 4- 4+  Hip extension    Hip abduction 5 5  Hip adduction 4 4  Hip internal rotation    Hip external rotation    Knee flexion 4-P! 4+P!  Knee extension 4-P! 4P!  Ankle dorsiflexion  full  Ankle plantarflexion  full  Ankle inversion    Ankle eversion     (Blank rows = not tested)  LOWER EXTREMITY SPECIAL TESTS:  Knee special tests: Anterior drawer test: positive right  and Posterior drawer test: positive right  FUNCTIONAL TESTS:  5 times sit to stand: 10.70 Timed up and go (TUG): 9.70 Berg Balance Scale: 38/56   GAIT: Distance walked: 421f Assistive device utilized: None Level of assistance: Complete Independence Comments: Able to walk in stores for up past 30 mins.  Left knee valgus deformity right knee varus (wind swept). Gait antlagic Would benefit from use of cane.  Instructed Pt. She will attain and be instructed on proper use   TODAY'S TREATMENT:                                                                                                                              Pt seen for aquatic therapy today.  Treatment took place in water 3.25-4.[place] Temp of water was 91.  Pt entered/exited the pool via stairs with step-to pattern with bilat rail.  * walking forward / backward and side stepping without UE support on barbell (feels best facing hot tub)  * TrA set with short blue noodle pull down to thighs, slow eccentric  return to surface * holding solid blue noodle: marching; df; pf; add single leg clams; x 10 each with cues for upright trunk with core engaged *STS from 3rd ( from bottom) x 7 then x 5 4th step. Pt completes without UE support and good immediate standing balance * Seated on bench: cycling, hip abdct/ add, flutter kick, LAQ 2 x 20 reps cues for increasing speed for added resistance    Pt requires the buoyancy and hydrostatic pressure of water for support, and to offload joints by unweighting joint load by at least 50 % in navel deep water and by at least 75-80% in chest to neck deep water.  Viscosity of the water is needed for resistance of strengthening. Water current perturbations provides challenge to standing balance requiring increased core activation.     PATIENT EDUCATION:  Education details: aquatic progressions and modifications Person educated: Patient Education method: Customer service manager Education comprehension: verbalized understanding and returned demonstration  HOME EXERCISE PROGRAM: Access Code: [email or web address] URL: [email or web address] Date: 03/25/2022 Prepared by: Denton Meek  Exercises - Supine Quad Set  - 1 x daily - 7 x weekly - 3 sets - 10 reps - Supine Heel Slide  - 1 x daily - 7 x weekly - 3 sets - 10 reps - Supine Active Straight Leg Raise  - 1 x daily - 7 x weekly - 3 sets - 10 reps  ASSESSMENT:  CLINICAL IMPRESSION: Pt edu on progression with aquatic and land based intervention.  She VU and will begin land based sessions soon. She reports excellent response to last few session with decreased claudication /pain left gastroc  and improved overall wellbeing. Pain in right ankle is intermittent. She reports improving mobility. Planning on becoming member here at [facility] for access to warm water pool.  Goals ongoing      From EVAL:  Patient is a 77 y.o. F who was seen today for physical therapy evaluation and treatment for bilateral OA knee. 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  She presnets today with long history of hip, knee and ankle trauma/dysfunctions with clearly a very high pain tolerance.  Pt has a significant left to right windswept deformity of knees.  Right ankle fixed in slight inversion with little mobility in any plane with her right knee compensating in (opposite) varus deformity .  Her objective test is wfl other than Berg balance which suggests she is a medium fall risk. Her gait is antalgic with > pain in right ankle and knee.  She is a good candidate for skilled physical therapy in aquatic setting (initially) using properties of water to improve strength, gait pattern balance and decrease pain. Will transition to land when appropriate.  OBJECTIVE IMPAIRMENTS: Abnormal gait, decreased balance, decreased knowledge of condition, decreased knowledge of use of DME, decreased mobility, difficulty walking, decreased ROM, decreased strength, postural dysfunction, and pain.   ACTIVITY LIMITATIONS: carrying, lifting, bending, standing, squatting, stairs, transfers, and locomotion level  PARTICIPATION LIMITATIONS: meal prep, cleaning, shopping, community activity, and yard work  PERSONAL FACTORS: Age, Behavior pattern, Fitness, and Time since onset of injury/illness/exacerbation are also affecting patient's functional outcome.   REHAB POTENTIAL: Good  CLINICAL DECISION MAKING: Evolving/moderate complexity  EVALUATION COMPLEXITY: Moderate   GOALS: Goals reviewed with patient? Yes  SHORT TERM GOALS: Target date: 04/22/22 Pt will tolerate sessions of aquatic therapy  without increase in pain to demonstrate positive response to setting intervention Baseline:tba Goal status: INITIAL  2.  Pt will be able to complete SLS submerged in 3.5f up to or >20s R/L to demonstrate ability to balance with improvement Baseline: unable to complete SLS land Goal status: INITIAL  3.  Pt will have increase in r hip ROM to improve toleration to ambulation Baseline: see chart Goal status: INITIAL  4.  Pt will demonstrate proper use of cane and report consistent use to decrease pain and improve mobility Baseline: not using Goal status: INITIAL   LONG TERM GOALS: Target date: 05/20/22  Pt will meet foto goal of 61% to demonstrate improved perception of functional mobility Baseline: 39% Goal status: INITIAL  2.  Worst pain to be < 4/10 to demonstrate improvement in condition Baseline: 8/10 Goal status: INITIAL  3.  Pt will improve hip flex and abd strength 1 full grade to demonstrate improved strength. Baseline: see chart Goal status: INITIAL  4.  Pt will improve on Berg balance test to up to or > 50/56 to demonstrate decreased fall risk Baseline: 38/56 Goal status: INITIAL  5.  Pt will improve in bilat knee strength by 1/2 grade despite chronic limiting pain. Baseline: see chart Goal status: INITIAL  6.  Pt will be Indep with land based final HEP to manage chronic conditions going forward Baseline: unmanaged LE dysfunction Goal status: INITIAL   PLAN:  PT FREQUENCY: 1-2x/week  PT DURATION: 8 weeks  PLANNED INTERVENTIONS: Therapeutic exercises, Therapeutic activity, Neuromuscular re-education, Balance training, Gait training, Patient/Family education, Self Care, Joint mobilization, Joint manipulation, Stair training, Orthotic/Fit training, DME instructions, Aquatic Therapy, Dry Needling, Electrical stimulation, Cryotherapy, Moist heat, Compression bandaging, scar mobilization, Splintting, Taping, Ultrasound, Contrast bath, Ionotophoresis '4mg'$ /ml  Dexamethasone, Manual therapy, and Re-evaluation  PLAN FOR NEXT SESSION: Aquatics for LE/core strengthening, balance and proprioceptive retraining, stair climbing, transfers, aerobic capacity. Land: strengthening, gait training, recommendations for bracing or orthotics as appropriate, HEP  MRandee Huston Sanaai Doane MPT 04/14/22 11.22 [facility] [address] [place] [address]Phone: [phone]  Fax:  [phone]

## 2022-04-16 DIAGNOSIS — H6120 Impacted cerumen, unspecified ear: Secondary | ICD-10-CM | POA: Diagnosis not present

## 2022-04-16 DIAGNOSIS — I10 Essential (primary) hypertension: Secondary | ICD-10-CM | POA: Diagnosis not present

## 2022-04-20 ENCOUNTER — Ambulatory Visit (HOSPITAL_BASED_OUTPATIENT_CLINIC_OR_DEPARTMENT_OTHER): Payer: Medicare Other | Attending: Sports Medicine | Admitting: Physical Therapy

## 2022-04-20 ENCOUNTER — Encounter (HOSPITAL_BASED_OUTPATIENT_CLINIC_OR_DEPARTMENT_OTHER): Payer: Self-pay | Admitting: Physical Therapy

## 2022-04-20 DIAGNOSIS — R262 Difficulty in walking, not elsewhere classified: Secondary | ICD-10-CM | POA: Insufficient documentation

## 2022-04-20 DIAGNOSIS — M6281 Muscle weakness (generalized): Secondary | ICD-10-CM | POA: Insufficient documentation

## 2022-04-20 DIAGNOSIS — M25562 Pain in left knee: Secondary | ICD-10-CM | POA: Insufficient documentation

## 2022-04-20 DIAGNOSIS — G8929 Other chronic pain: Secondary | ICD-10-CM | POA: Insufficient documentation

## 2022-04-20 DIAGNOSIS — M25561 Pain in right knee: Secondary | ICD-10-CM | POA: Insufficient documentation

## 2022-04-20 NOTE — Therapy (Signed)
OUTPATIENT PHYSICAL THERAPY LOWER EXTREMITY TREATMENT   Patient Name: Kelly Hancock MRN: [id number] DOB:1945/11/06, 77 y.o., female Today's Date: 04/20/2022  END OF SESSION:  PT End of Session - 04/20/22 1324     Visit Number 7    Number of Visits 16    Date for PT Re-Evaluation 05/20/22    Authorization Type BCBS mdcr    PT Start Time [phone]    PT Stop Time 1030    PT Time Calculation (min) 44 min    Activity Tolerance Patient tolerated treatment well    Behavior During Therapy [facility] for tasks assessed/performed               Past Medical History:  Diagnosis Date   History of breast cancer 09/2015   L   Osteoarthritis    right hip   PONV (postoperative nausea and vomiting)    Past Surgical History:  Procedure Laterality Date   APPENDECTOMY     BREAST LUMPECTOMY Left 11/12/2015   BREAST LUMPECTOMY WITH RADIOACTIVE SEED AND SENTINEL LYMPH NODE BIOPSY Left 11/12/2015   Procedure: BREAST LUMPECTOMY WITH RADIOACTIVE SEED AND SENTINEL LYMPH NODE BIOPSY;  Surgeon: Excell Seltzer, MD;  Location: [place];  Service: General;  Laterality: Left;   BREAST REDUCTION SURGERY Bilateral 11/26/2015   Procedure: MAMMARY REDUCTION  (BREAST) FOR ASYMMETRY;  Surgeon: Wallace Going, DO;  Location: [place];  Service: Plastics;  Laterality: Bilateral;   LIPOSUCTION Bilateral 11/26/2015   Procedure: LIPOSUCTION;  Surgeon: Wallace Going, DO;  Location: [place];  Service: Plastics;  Laterality: Bilateral;   REDUCTION MAMMAPLASTY Bilateral 10/2015   TOTAL HIP ARTHROPLASTY Right    Patient Active Problem List   Diagnosis Date Noted   Mechanical knee pain, left 08/28/2020   Carcinoma of upper-outer quadrant of left female breast ([place]) 09/05/2015   Post-traumatic osteoarthritis of right knee 08/29/2014   Shoulder dislocation, recurrent 07/29/2010   DEGENERATIVE JOINT DISEASE, LEFT SHOULDER 03/19/2010   Osteoarthritis of ankle and  foot 11/19/2009   GAD (generalized anxiety disorder) 05/12/2006   DEPRESSIVE DISORDER, NOS 05/12/2006   DIVERTICULITIS OF COLON, NOS 05/12/2006   MENOPAUSAL SYNDROME 05/12/2006    PCP: Cyndi Bender, PA-C   REFERRING PROVIDER: Stefanie Libel, MD   REFERRING DIAG:  Diagnosis  M17.11 (ICD-10-CM) - Primary osteoarthritis of right knee  M17.12 (ICD-10-CM) - Primary osteoarthritis of left knee    THERAPY DIAG:  Chronic pain of left knee  Chronic pain of right knee  Difficulty in walking, not elsewhere classified  Rationale for Evaluation and Treatment: Rehabilitation  ONSET DATE: >10 yrs  SUBJECTIVE:   SUBJECTIVE STATEMENT: Pt reports she was able to do some light outdoor activity over weekend and enjoyed it without increase in pain   PERTINENT HISTORY: Right ankle fx ~ 15 yrs ago: Pt fell down steps reset it by herself did not seek medical treatment. It did not set properly. Birth: right hip dislocatred.  Had THR x35 yrs ago, not ever revised.  Some discomfort PAIN:  Are you having pain? Yes: NPRS scale:  4/10 ankle: 2/10 knee Pain location: Left knees and Right ankle Pain description: splitting pain through left knee Aggravating factors: walking Relieving factors: sitting, hot shower  PRECAUTIONS: Knee and Fall  WEIGHT BEARING RESTRICTIONS: No  FALLS:  Has patient fallen in last 6 months? No  LIVING ENVIRONMENT: Lives with: lives with their family and lives with their spouse Lives in: House/apartment Stairs: Yes: Internal: 16 steps; on  left going up Has following equipment at home: Single point cane, Walker - 2 wheeled, and Crutches  OCCUPATION: retired  PLOF: Independent  PATIENT GOALS: stronger, more mobile, more physically fit, less pain  NEXT MD VISIT: 1 month  OBJECTIVE:   DIAGNOSTIC FINDINGS: bilateral knee x-rays. The right knee shows lateral compartment advanced arthritis and grade 4 patellofemoral arthritis. The left knee shows grade 4 medial  compartment arthritis and grade 4 patellofemoral arthritis   PATIENT SURVEYS:  FOTO 39% with goal of 61% at 13 weeks  COGNITION: Overall cognitive status: Within functional limits for tasks assessed     SENSATION: WFL  EDEMA:  N/a  MUSCLE LENGTH: WFL   POSTURE:  LE windswept  left to right  PALPATION: TTP left medial knee jpint line  LOWER EXTREMITY ROM:   R ankle fixed in slight inversion.  No movement available.  Active ROM Right eval Left eval  Hip flexion wfl   Hip extension 15   Hip abduction 30   Hip adduction 10   Hip internal rotation    Hip external rotation    Knee flexion 140 120  Knee extension 0 0  Ankle dorsiflexion 3 wfl  Ankle plantarflexion 3   Ankle inversion 3   Ankle eversion -5    (Blank rows = not tested)  LOWER EXTREMITY MMT:  MMT Right eval Left eval  Hip flexion 4- 4+  Hip extension    Hip abduction 5 5  Hip adduction 4 4  Hip internal rotation    Hip external rotation    Knee flexion 4-P! 4+P!  Knee extension 4-P! 4P!  Ankle dorsiflexion  full  Ankle plantarflexion  full  Ankle inversion    Ankle eversion     (Blank rows = not tested)  LOWER EXTREMITY SPECIAL TESTS:  Knee special tests: Anterior drawer test: positive right  and Posterior drawer test: positive right  FUNCTIONAL TESTS:  5 times sit to stand: 10.70 Timed up and go (TUG): 9.70 Berg Balance Scale: 38/56   GAIT: Distance walked: 451f Assistive device utilized: None Level of assistance: Complete Independence Comments: Able to walk in stores for up past 30 mins.  Left knee valgus deformity right knee varus (wind swept). Gait antlagic Would benefit from use of cane.  Instructed Pt. She will attain and be instructed on proper use   TODAY'S TREATMENT:                                                                                                                              Pt seen for aquatic therapy today.  Treatment took place in water 3.25[place]4.[place] Temp of [place] was 91.  Pt entered/exited the pool via stairs with step-to pattern with bilat rail.  * walking forward / backward and side stepping without UE support on barbell *STS from 3rd ( from bottom) x 7 then x 5 4th step. Pt completes  without UE support and good immediate standing balance *Adductor sets using BB 10 x 5s hold *STS from 3rd step with add set x8 * Standing unsupported:  1/2 diamonds; marching; single leg clams; x 10 each with cues for upright trunk with core engaged * TrA set with short blue noodle pull down to thighs, slow eccentric return to surface *SLS ue unsupported: left >20s, right >20 (with slight swimmy arms) * Seated on bench: cycling, hip abdct/ add, flutter kick, LAQ 2 x 20 reps cues for increasing speed for added resistance    Pt requires the buoyancy and hydrostatic pressure of water for support, and to offload joints by unweighting joint load by at least 50 % in navel deep water and by at least 75-80% in chest to neck deep water.  Viscosity of the water is needed for resistance of strengthening. Water current perturbations provides challenge to standing balance requiring increased core activation.     PATIENT EDUCATION:  Education details: aquatic progressions and modifications Person educated: Patient Education method: Customer service manager Education comprehension: verbalized understanding and returned demonstration  HOME EXERCISE PROGRAM: Access Code: [email or web address] URL: [email or web address] Date: 03/25/2022 Prepared by: Denton Meek  Exercises - Supine Quad Set  - 1 x daily - 7 x weekly - 3 sets - 10 reps - Supine Heel Slide  - 1 x daily - 7 x weekly - 3 sets - 10 reps - Supine Active Straight Leg Raise  - 1 x daily - 7 x weekly - 3 sets - 10 reps  ASSESSMENT:  CLINICAL IMPRESSION: Progressed strengthening and balance tasks.  Worked standing balance on lle to allow for motor plan  for decreased fall risk if pt finds herself reflexively recovering for LOB in that direction. STG addressed as she has met 3/4 stg.  Upon completion of session today she does have increased right ankle pain with increased activity submerged on RLE.  She is encouraged to bring cane as she reports she doesn't like to use.       From EVAL:  Patient is a 77 y.o. F who was seen today for physical therapy evaluation and treatment for bilateral OA knee. 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  She presnets today with long history of hip, knee and ankle trauma/dysfunctions with clearly a very high pain tolerance.  Pt has a significant left to right windswept deformity of knees.  Right ankle fixed in slight inversion with little mobility in any plane with her right knee compensating in (opposite) varus deformity .  Her objective test is wfl other than Berg balance which suggests she is a medium fall risk. Her gait is antalgic with > pain in right ankle and knee.  She is a good candidate for skilled physical therapy in aquatic setting (initially) using properties of water to improve strength, gait pattern balance and decrease pain. Will transition to land when appropriate.  OBJECTIVE IMPAIRMENTS: Abnormal gait, decreased balance, decreased knowledge of condition, decreased knowledge of use of DME, decreased mobility, difficulty walking, decreased ROM, decreased strength, postural dysfunction, and pain.   ACTIVITY LIMITATIONS: carrying, lifting, bending, standing, squatting, stairs, transfers, and locomotion level  PARTICIPATION LIMITATIONS: meal prep, cleaning, shopping, community activity, and yard work  PERSONAL FACTORS: Age, Behavior pattern, Fitness, and Time since onset of injury/illness/exacerbation are also affecting patient's functional outcome.   REHAB POTENTIAL: Good  CLINICAL DECISION MAKING: Evolving/moderate complexity  EVALUATION COMPLEXITY:  Moderate   GOALS: Goals reviewed  with patient? Yes  SHORT TERM GOALS: Target date: 04/22/22 Pt will tolerate sessions of aquatic therapy without increase in pain to demonstrate positive response to setting intervention Baseline:tba Goal status: Met 04/20/22  2.  Pt will be able to complete SLS submerged in 3.24f up to or >20s R/L to demonstrate ability to balance with improvement Baseline: unable to complete SLS land Goal status: Met 04/20/22  3.  Pt will have increase in r hip ROM to improve toleration to ambulation Baseline: see chart Goal status: Met 04/20/22  4.  Pt will demonstrate proper use of cane and report consistent use to decrease pain and improve mobility Baseline: not using Goal status: Ongoing 04/20/22   LONG TERM GOALS: Target date: 05/20/22  Pt will meet foto goal of 61% to demonstrate improved perception of functional mobility Baseline: 39% Goal status: INITIAL  2.  Worst pain to be < 4/10 to demonstrate improvement in condition Baseline: 8/10 Goal status: INITIAL  3.  Pt will improve hip flex and abd strength 1 full grade to demonstrate improved strength. Baseline: see chart Goal status: INITIAL  4.  Pt will improve on Berg balance test to up to or > 50/56 to demonstrate decreased fall risk Baseline: 38/56 Goal status: INITIAL  5.  Pt will improve in bilat knee strength by 1/2 grade despite chronic limiting pain. Baseline: see chart Goal status: INITIAL  6.  Pt will be Indep with land based final HEP to manage chronic conditions going forward Baseline: unmanaged LE dysfunction Goal status: INITIAL   PLAN:  PT FREQUENCY: 1-2x/week  PT DURATION: 8 weeks  PLANNED INTERVENTIONS: Therapeutic exercises, Therapeutic activity, Neuromuscular re-education, Balance training, Gait training, Patient/Family education, Self Care, Joint mobilization, Joint manipulation, Stair training, Orthotic/Fit training, DME instructions, Aquatic Therapy, Dry Needling, Electrical  stimulation, Cryotherapy, Moist heat, Compression bandaging, scar mobilization, Splintting, Taping, Ultrasound, Contrast bath, Ionotophoresis '4mg'$ /ml Dexamethasone, Manual therapy, and Re-evaluation  PLAN FOR NEXT SESSION: Aquatics for LE/core strengthening, balance and proprioceptive retraining, stair climbing, transfers, aerobic capacity. Land: strengthening, gait training, recommendations for bracing or orthotics as appropriate, HEP  MAda Holness Ledon Weihe MPT 04/20/22 [address] [address]Phone: [phone]  Fax:  [phone]

## 2022-04-22 ENCOUNTER — Encounter (HOSPITAL_BASED_OUTPATIENT_CLINIC_OR_DEPARTMENT_OTHER): Payer: Self-pay | Admitting: Physical Therapy

## 2022-04-22 ENCOUNTER — Ambulatory Visit (HOSPITAL_BASED_OUTPATIENT_CLINIC_OR_DEPARTMENT_OTHER): Payer: Medicare Other | Admitting: Physical Therapy

## 2022-04-22 DIAGNOSIS — R262 Difficulty in walking, not elsewhere classified: Secondary | ICD-10-CM | POA: Diagnosis not present

## 2022-04-22 DIAGNOSIS — M25562 Pain in left knee: Secondary | ICD-10-CM | POA: Diagnosis not present

## 2022-04-22 DIAGNOSIS — G8929 Other chronic pain: Secondary | ICD-10-CM | POA: Diagnosis not present

## 2022-04-22 DIAGNOSIS — M6281 Muscle weakness (generalized): Secondary | ICD-10-CM | POA: Diagnosis not present

## 2022-04-22 DIAGNOSIS — M25561 Pain in right knee: Secondary | ICD-10-CM | POA: Diagnosis not present

## 2022-04-22 NOTE — Therapy (Signed)
OUTPATIENT PHYSICAL THERAPY LOWER EXTREMITY TREATMENT   Patient Name: Kelly Hancock MRN: [id number] DOB:1946-01-28, 77 y.o., female Today's Date: 04/22/2022  END OF SESSION:  PT End of Session - 04/22/22 0953     Visit Number 8    Number of Visits 16    Date for PT Re-Evaluation 05/20/22    Authorization Type BCBS mdcr    PT Start Time 0945    PT Stop Time 1030    PT Time Calculation (min) 45 min    Activity Tolerance Patient tolerated treatment well    Behavior During Therapy [facility] for tasks assessed/performed                Past Medical History:  Diagnosis Date   History of breast cancer 09/2015   L   Osteoarthritis    right hip   PONV (postoperative nausea and vomiting)    Past Surgical History:  Procedure Laterality Date   APPENDECTOMY     BREAST LUMPECTOMY Left 11/12/2015   BREAST LUMPECTOMY WITH RADIOACTIVE SEED AND SENTINEL LYMPH NODE BIOPSY Left 11/12/2015   Procedure: BREAST LUMPECTOMY WITH RADIOACTIVE SEED AND SENTINEL LYMPH NODE BIOPSY;  Surgeon: Excell Seltzer, MD;  Location: [place];  Service: General;  Laterality: Left;   BREAST REDUCTION SURGERY Bilateral 11/26/2015   Procedure: MAMMARY REDUCTION  (BREAST) FOR ASYMMETRY;  Surgeon: Wallace Going, DO;  Location: [place];  Service: Plastics;  Laterality: Bilateral;   LIPOSUCTION Bilateral 11/26/2015   Procedure: LIPOSUCTION;  Surgeon: Wallace Going, DO;  Location: [place];  Service: Plastics;  Laterality: Bilateral;   REDUCTION MAMMAPLASTY Bilateral 10/2015   TOTAL HIP ARTHROPLASTY Right    Patient Active Problem List   Diagnosis Date Noted   Mechanical knee pain, left 08/28/2020   Carcinoma of upper-outer quadrant of left female breast ([place]) 09/05/2015   Post-traumatic osteoarthritis of right knee 08/29/2014   Shoulder dislocation, recurrent 07/29/2010   DEGENERATIVE JOINT DISEASE, LEFT SHOULDER 03/19/2010   Osteoarthritis of ankle  and foot 11/19/2009   GAD (generalized anxiety disorder) 05/12/2006   DEPRESSIVE DISORDER, NOS 05/12/2006   DIVERTICULITIS OF COLON, NOS 05/12/2006   MENOPAUSAL SYNDROME 05/12/2006    PCP: Cyndi Bender, PA-C   REFERRING PROVIDER: Stefanie Libel, MD   REFERRING DIAG:  Diagnosis  M17.11 (ICD-10-CM) - Primary osteoarthritis of right knee  M17.12 (ICD-10-CM) - Primary osteoarthritis of left knee    THERAPY DIAG:  Chronic pain of left knee  Chronic pain of right knee  Difficulty in walking, not elsewhere classified  Rationale for Evaluation and Treatment: Rehabilitation  ONSET DATE: >10 yrs  SUBJECTIVE:   SUBJECTIVE STATEMENT: Pt reports feeling good. "This therapy is helping me so much"   PERTINENT HISTORY: Right ankle fx ~ 15 yrs ago: Pt fell down steps reset it by herself did not seek medical treatment. It did not set properly. Birth: right hip dislocatred.  Had THR x35 yrs ago, not ever revised.  Some discomfort PAIN:  Are you having pain? Yes: NPRS scale:  2/10 ankle: 2/10 knee Pain location: Left knees and Right ankle Pain description: splitting pain through left knee Aggravating factors: walking Relieving factors: sitting, hot shower  PRECAUTIONS: Knee and Fall  WEIGHT BEARING RESTRICTIONS: No  FALLS:  Has patient fallen in last 6 months? No  LIVING ENVIRONMENT: Lives with: lives with their family and lives with their spouse Lives in: House/apartment Stairs: Yes: Internal: 16 steps; on left going up Has following equipment at home:  Single point cane, Walker - 2 wheeled, and Crutches  OCCUPATION: retired  PLOF: Independent  PATIENT GOALS: stronger, more mobile, more physically fit, less pain  NEXT MD VISIT: 1 month  OBJECTIVE:   DIAGNOSTIC FINDINGS: bilateral knee x-rays. The right knee shows lateral compartment advanced arthritis and grade 4 patellofemoral arthritis. The left knee shows grade 4 medial compartment arthritis and grade 4  patellofemoral arthritis   PATIENT SURVEYS:  FOTO 39% with goal of 61% at 13 weeks  COGNITION: Overall cognitive status: Within functional limits for tasks assessed     SENSATION: WFL  EDEMA:  N/a  MUSCLE LENGTH: WFL   POSTURE:  LE windswept  left to right  PALPATION: TTP left medial knee jpint line  LOWER EXTREMITY ROM:   R ankle fixed in slight inversion.  No movement available.  Active ROM Right eval Left eval  Hip flexion wfl   Hip extension 15   Hip abduction 30   Hip adduction 10   Hip internal rotation    Hip external rotation    Knee flexion 140 120  Knee extension 0 0  Ankle dorsiflexion 3 wfl  Ankle plantarflexion 3   Ankle inversion 3   Ankle eversion -5    (Blank rows = not tested)  LOWER EXTREMITY MMT:  MMT Right eval Left eval  Hip flexion 4- 4+  Hip extension    Hip abduction 5 5  Hip adduction 4 4  Hip internal rotation    Hip external rotation    Knee flexion 4-P! 4+P!  Knee extension 4-P! 4P!  Ankle dorsiflexion  full  Ankle plantarflexion  full  Ankle inversion    Ankle eversion     (Blank rows = not tested)  LOWER EXTREMITY SPECIAL TESTS:  Knee special tests: Anterior drawer test: positive right  and Posterior drawer test: positive right  FUNCTIONAL TESTS:  5 times sit to stand: 10.70 Timed up and go (TUG): 9.70 Berg Balance Scale: 38/56   GAIT: Distance walked: 418f Assistive device utilized: None Level of assistance: Complete Independence Comments: Able to walk in stores for up past 30 mins.  Left knee valgus deformity right knee varus (wind swept). Gait antlagic Would benefit from use of cane.  Instructed Pt. She will attain and be instructed on proper use   TODAY'S TREATMENT:                                                                                                                              Pt seen for aquatic therapy today.  Treatment took place in water 3.25-4.5 ft [place] Temp of water was 91.  Pt entered/exited the pool via stairs with step-to pattern with bilat rail.  * walking forward / backward and side stepping without UE support on barbell *SLS ue unsupported 4.0 then 3.666f Able to hold without sculling R and L > 25s in both depths *Standing unsupported for strengthening  and balance: hip add/abd; flex; ext;flex/ext x10. Cues for upright engaged trunk  - warrior II using yellow noodle x5 R then L * TrA set with solid green noodle pull down to thighs, slow eccentric return to surface *STS from 3rd ( from bottom) x 5 then x 5 with add se using BB. Pt completes without UE support and good immediate standing balance *Adductor sets using BB 10 x 5s hold * Standing unsupported with ankle foam: hip add/abd; flex x 10 each with cues for upright trunk with core engaged   Pt requires the buoyancy and hydrostatic pressure of water for support, and to offload joints by unweighting joint load by at least 50 % in navel deep water and by at least 75-80% in chest to neck deep water.  Viscosity of the water is needed for resistance of strengthening. Water current perturbations provides challenge to standing balance requiring increased core activation.     PATIENT EDUCATION:  Education details: aquatic progressions and modifications Person educated: Patient Education method: Customer service manager Education comprehension: verbalized understanding and returned demonstration  HOME EXERCISE PROGRAM: Access Code: [email or web address] URL: [email or web address] Date: 03/25/2022 Prepared by: Denton Meek  Exercises - Supine Quad Set  - 1 x daily - 7 x weekly - 3 sets - 10 reps - Supine Heel Slide  - 1 x daily - 7 x weekly - 3 sets - 10 reps - Supine Active Straight Leg Raise  - 1 x daily - 7 x weekly - 3 sets - 10 reps  ASSESSMENT:  CLINICAL IMPRESSION: Pt reports needing to use crutches for a little while after last session. Says she had a very  busy day which added to right ankle pain.  Pt given instruction on proper use of cane as she states she is using in RUE.  She VU it should be in Left and will bring it with her next visit for formal instruction on its use. She demonstrates improved SLS balance and strength as she tolerates increased foam weight with exercises. She does require VC to limit intensity with right ankle pain as she tends to push through unnecessarily.  Goals ongoing.        From EVAL:  Patient is a 77 y.o. F who was seen today for physical therapy evaluation and treatment for bilateral OA knee. 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  She presnets today with long history of hip, knee and ankle trauma/dysfunctions with clearly a very high pain tolerance.  Pt has a significant left to right windswept deformity of knees.  Right ankle fixed in slight inversion with little mobility in any plane with her right knee compensating in (opposite) varus deformity .  Her objective test is wfl other than Berg balance which suggests she is a medium fall risk. Her gait is antalgic with > pain in right ankle and knee.  She is a good candidate for skilled physical therapy in aquatic setting (initially) using properties of water to improve strength, gait pattern balance and decrease pain. Will transition to land when appropriate.  OBJECTIVE IMPAIRMENTS: Abnormal gait, decreased balance, decreased knowledge of condition, decreased knowledge of use of DME, decreased mobility, difficulty walking, decreased ROM, decreased strength, postural dysfunction, and pain.   ACTIVITY LIMITATIONS: carrying, lifting, bending, standing, squatting, stairs, transfers, and locomotion level  PARTICIPATION LIMITATIONS: meal prep, cleaning, shopping, community activity, and yard work  PERSONAL FACTORS: Age, Behavior pattern, Fitness, and Time since onset of injury/illness/exacerbation  are also affecting patient's functional  outcome.   REHAB POTENTIAL: Good  CLINICAL DECISION MAKING: Evolving/moderate complexity  EVALUATION COMPLEXITY: Moderate   GOALS: Goals reviewed with patient? Yes  SHORT TERM GOALS: Target date: 04/22/22 Pt will tolerate sessions of aquatic therapy without increase in pain to demonstrate positive response to setting intervention Baseline:tba Goal status: Met 04/20/22  2.  Pt will be able to complete SLS submerged in 3.53f up to or >20s R/L to demonstrate ability to balance with improvement Baseline: unable to complete SLS land Goal status: Met 04/20/22  3.  Pt will have increase in r hip ROM to improve toleration to ambulation Baseline: see chart Goal status: Met 04/20/22  4.  Pt will demonstrate proper use of cane and report consistent use to decrease pain and improve mobility Baseline: not using Goal status: Ongoing 04/20/22   LONG TERM GOALS: Target date: 05/20/22  Pt will meet foto goal of 61% to demonstrate improved perception of functional mobility Baseline: 39% Goal status: INITIAL  2.  Worst pain to be < 4/10 to demonstrate improvement in condition Baseline: 8/10 Goal status: INITIAL  3.  Pt will improve hip flex and abd strength 1 full grade to demonstrate improved strength. Baseline: see chart Goal status: INITIAL  4.  Pt will improve on Berg balance test to up to or > 50/56 to demonstrate decreased fall risk Baseline: 38/56 Goal status: INITIAL  5.  Pt will improve in bilat knee strength by 1/2 grade despite chronic limiting pain. Baseline: see chart Goal status: INITIAL  6.  Pt will be Indep with land based final HEP to manage chronic conditions going forward Baseline: unmanaged LE dysfunction Goal status: INITIAL   PLAN:  PT FREQUENCY: 1-2x/week  PT DURATION: 8 weeks  PLANNED INTERVENTIONS: Therapeutic exercises, Therapeutic activity, Neuromuscular re-education, Balance training, Gait training, Patient/Family education, Self Care, Joint mobilization,  Joint manipulation, Stair training, Orthotic/Fit training, DME instructions, Aquatic Therapy, Dry Needling, Electrical stimulation, Cryotherapy, Moist heat, Compression bandaging, scar mobilization, Splintting, Taping, Ultrasound, Contrast bath, Ionotophoresis '4mg'$ /ml Dexamethasone, Manual therapy, and Re-evaluation  PLAN FOR NEXT SESSION: Aquatics for LE/core strengthening, balance and proprioceptive retraining, stair climbing, transfers, aerobic capacity. Land: strengthening, gait training, recommendations for bracing or orthotics as appropriate, HEP  MTyquasia Pant Myah Guynes MPT 04/22/22 9[facility] [address]Jen[address] [place] [address]Phone: [phone]  Fax:  [phone]

## 2022-04-26 ENCOUNTER — Ambulatory Visit (HOSPITAL_BASED_OUTPATIENT_CLINIC_OR_DEPARTMENT_OTHER): Payer: Medicare Other | Admitting: Physical Therapy

## 2022-04-26 ENCOUNTER — Encounter (HOSPITAL_BASED_OUTPATIENT_CLINIC_OR_DEPARTMENT_OTHER): Payer: Self-pay | Admitting: Physical Therapy

## 2022-04-26 DIAGNOSIS — R262 Difficulty in walking, not elsewhere classified: Secondary | ICD-10-CM | POA: Diagnosis not present

## 2022-04-26 DIAGNOSIS — G8929 Other chronic pain: Secondary | ICD-10-CM

## 2022-04-26 DIAGNOSIS — M25562 Pain in left knee: Secondary | ICD-10-CM | POA: Diagnosis not present

## 2022-04-26 DIAGNOSIS — M6281 Muscle weakness (generalized): Secondary | ICD-10-CM | POA: Diagnosis not present

## 2022-04-26 DIAGNOSIS — M25561 Pain in right knee: Secondary | ICD-10-CM | POA: Diagnosis not present

## 2022-04-26 NOTE — Therapy (Signed)
OUTPATIENT PHYSICAL THERAPY LOWER EXTREMITY TREATMENT   Patient Name: Kelly Hancock MRN: [id number] DOB:Sep 14, 1945, 77 y.o., female Today's Date: 04/26/2022  END OF SESSION:  PT End of Session - 04/26/22 1038     Visit Number 9    Number of Visits 16    Date for PT Re-Evaluation 05/20/22    Authorization Type BCBS mdcr    PT Start Time 1035    PT Stop Time 1115    PT Time Calculation (min) 40 min    Activity Tolerance Patient tolerated treatment well    Behavior During Therapy [facility] for tasks assessed/performed                Past Medical History:  Diagnosis Date   History of breast cancer 09/2015   L   Osteoarthritis    right hip   PONV (postoperative nausea and vomiting)    Past Surgical History:  Procedure Laterality Date   APPENDECTOMY     BREAST LUMPECTOMY Left 11/12/2015   BREAST LUMPECTOMY WITH RADIOACTIVE SEED AND SENTINEL LYMPH NODE BIOPSY Left 11/12/2015   Procedure: BREAST LUMPECTOMY WITH RADIOACTIVE SEED AND SENTINEL LYMPH NODE BIOPSY;  Surgeon: Excell Seltzer, MD;  Location: [place];  Service: General;  Laterality: Left;   BREAST REDUCTION SURGERY Bilateral 11/26/2015   Procedure: MAMMARY REDUCTION  (BREAST) FOR ASYMMETRY;  Surgeon: Wallace Going, DO;  Location: [place];  Service: Plastics;  Laterality: Bilateral;   LIPOSUCTION Bilateral 11/26/2015   Procedure: LIPOSUCTION;  Surgeon: Wallace Going, DO;  Location: [place];  Service: Plastics;  Laterality: Bilateral;   REDUCTION MAMMAPLASTY Bilateral 10/2015   TOTAL HIP ARTHROPLASTY Right    Patient Active Problem List   Diagnosis Date Noted   Mechanical knee pain, left 08/28/2020   Carcinoma of upper-outer quadrant of left female breast ([place]) 09/05/2015   Post-traumatic osteoarthritis of right knee 08/29/2014   Shoulder dislocation, recurrent 07/29/2010   DEGENERATIVE JOINT DISEASE, LEFT SHOULDER 03/19/2010   Osteoarthritis of ankle  and foot 11/19/2009   GAD (generalized anxiety disorder) 05/12/2006   DEPRESSIVE DISORDER, NOS 05/12/2006   DIVERTICULITIS OF COLON, NOS 05/12/2006   MENOPAUSAL SYNDROME 05/12/2006    PCP: Cyndi Bender, PA-C   REFERRING PROVIDER: Stefanie Libel, MD   REFERRING DIAG:  Diagnosis  M17.11 (ICD-10-CM) - Primary osteoarthritis of right knee  M17.12 (ICD-10-CM) - Primary osteoarthritis of left knee    THERAPY DIAG:  Chronic pain of left knee  Chronic pain of right knee  Difficulty in walking, not elsewhere classified  Muscle weakness (generalized)  Rationale for Evaluation and Treatment: Rehabilitation  ONSET DATE: >10 yrs  SUBJECTIVE:   SUBJECTIVE STATEMENT: "No pain yesterday in my ankle"   PERTINENT HISTORY: Right ankle fx ~ 15 yrs ago: Pt fell down steps reset it by herself did not seek medical treatment. It did not set properly. Birth: right hip dislocatred.  Had THR x35 yrs ago, not ever revised.  Some discomfort PAIN:  Are you having pain? Yes: NPRS scale:  2/10 ankle: 2/10 knee Pain location: Left knees and Right ankle Pain description: splitting pain through left knee Aggravating factors: walking Relieving factors: sitting, hot shower  PRECAUTIONS: Knee and Fall  WEIGHT BEARING RESTRICTIONS: No  FALLS:  Has patient fallen in last 6 months? No  LIVING ENVIRONMENT: Lives with: lives with their family and lives with their spouse Lives in: House/apartment Stairs: Yes: Internal: 16 steps; on left going up Has following equipment at home: Single  point cane, Walker - 2 wheeled, and Crutches  OCCUPATION: retired  PLOF: Independent  PATIENT GOALS: stronger, more mobile, more physically fit, less pain  NEXT MD VISIT: 1 month  OBJECTIVE:   DIAGNOSTIC FINDINGS: bilateral knee x-rays. The right knee shows lateral compartment advanced arthritis and grade 4 patellofemoral arthritis. The left knee shows grade 4 medial compartment arthritis and grade 4  patellofemoral arthritis   PATIENT SURVEYS:  FOTO 39% with goal of 61% at 13 weeks  COGNITION: Overall cognitive status: Within functional limits for tasks assessed     SENSATION: WFL  EDEMA:  N/a  MUSCLE LENGTH: WFL   POSTURE:  LE windswept  left to right  PALPATION: TTP left medial knee jpint line  LOWER EXTREMITY ROM:   R ankle fixed in slight inversion.  No movement available.  Active ROM Right eval Left eval  Hip flexion wfl   Hip extension 15   Hip abduction 30   Hip adduction 10   Hip internal rotation    Hip external rotation    Knee flexion 140 120  Knee extension 0 0  Ankle dorsiflexion 3 wfl  Ankle plantarflexion 3   Ankle inversion 3   Ankle eversion -5    (Blank rows = not tested)  LOWER EXTREMITY MMT:  MMT Right eval Left eval  Hip flexion 4- 4+  Hip extension    Hip abduction 5 5  Hip adduction 4 4  Hip internal rotation    Hip external rotation    Knee flexion 4-P! 4+P!  Knee extension 4-P! 4P!  Ankle dorsiflexion  full  Ankle plantarflexion  full  Ankle inversion    Ankle eversion     (Blank rows = not tested)  LOWER EXTREMITY SPECIAL TESTS:  Knee special tests: Anterior drawer test: positive right  and Posterior drawer test: positive right  FUNCTIONAL TESTS:  5 times sit to stand: 10.70 Timed up and go (TUG): 9.70 Berg Balance Scale: 38/56   GAIT: Distance walked: 45f Assistive device utilized: None Level of assistance: Complete Independence Comments: Able to walk in stores for up past 30 mins.  Left knee valgus deformity right knee varus (wind swept). Gait antlagic Would benefit from use of cane.  Instructed Pt. She will attain and be instructed on proper use   TODAY'S TREATMENT:                                                                                                                              Pt seen for aquatic therapy today.  Treatment took place in water 3.25[place]4.[place] Temp of water was 91.  Pt entered/exited the pool via stairs with step-to pattern with bilat rail.  * walking forward / backward and side stepping without UE support on barbell *SLS ue unsupported 4.0 (RLE) then 3.673f(LLE): Able to holdR and L > 25s in both depths  - 1/2 clamshell  -  warrior II using yellow noodle x5 Lle only 3.69f  holding x 10s; RLE 4.3 *cycling on noodle * TrA set with solid green noodle pull down to thighs, slow eccentric return to surface x10 wide stance then staggered. * Standing unsupported with ankle foam: hip add/abd; flex x 10 each with cues for upright trunk with core engaged   Pt requires the buoyancy and hydrostatic pressure of water for support, and to offload joints by unweighting joint load by at least 50 % in navel deep water and by at least 75-80% in chest to neck deep water.  Viscosity of the water is needed for resistance of strengthening. Water current perturbations provides challenge to standing balance requiring increased core activation.  Pt edu on x-rays of right ankle, implications and management of deformity Gait training using cane    PATIENT EDUCATION:  Education details: aquatic progressions and modifications Person educated: Patient Education method: ECustomer service managerEducation comprehension: verbalized understanding and returned demonstration  HOME EXERCISE PROGRAM: Access Code: [email or web address] URL: [email or web address] Date: 03/25/2022 Prepared by: FDenton Meek Exercises - Supine Quad Set  - 1 x daily - 7 x weekly - 3 sets - 10 reps - Supine Heel Slide  - 1 x daily - 7 x weekly - 3 sets - 10 reps - Supine Active Straight Leg Raise  - 1 x daily - 7 x weekly - 3 sets - 10 reps  ASSESSMENT:  CLINICAL IMPRESSION: Cane adjusted to proper height. Instruction on its use. Pt demonstrate safety and indep with it's use.  She does have slight pain in right ankle with SLS on right balance challenges.  Moved to  deeper submersion with decreased sensitivity.  Goals ongoing         From EVAL:  Patient is a 77y.o. F who was seen today for physical therapy evaluation and treatment for bilateral OA knee. 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  She presnets today with long history of hip, knee and ankle trauma/dysfunctions with clearly a very high pain tolerance.  Pt has a significant left to right windswept deformity of knees.  Right ankle fixed in slight inversion with little mobility in any plane with her right knee compensating in (opposite) varus deformity .  Her objective test is wfl other than Berg balance which suggests she is a medium fall risk. Her gait is antalgic with > pain in right ankle and knee.  She is a good candidate for skilled physical therapy in aquatic setting (initially) using properties of water to improve strength, gait pattern balance and decrease pain. Will transition to land when appropriate.  OBJECTIVE IMPAIRMENTS: Abnormal gait, decreased balance, decreased knowledge of condition, decreased knowledge of use of DME, decreased mobility, difficulty walking, decreased ROM, decreased strength, postural dysfunction, and pain.   ACTIVITY LIMITATIONS: carrying, lifting, bending, standing, squatting, stairs, transfers, and locomotion level  PARTICIPATION LIMITATIONS: meal prep, cleaning, shopping, community activity, and yard work  PERSONAL FACTORS: Age, Behavior pattern, Fitness, and Time since onset of injury/illness/exacerbation are also affecting patient's functional outcome.   REHAB POTENTIAL: Good  CLINICAL DECISION MAKING: Evolving/moderate complexity  EVALUATION COMPLEXITY: Moderate   GOALS: Goals reviewed with patient? Yes  SHORT TERM GOALS: Target date: 04/22/22 Pt will tolerate sessions of aquatic therapy without increase in pain to demonstrate positive response to setting intervention Baseline:tba Goal status: Met 04/20/22  2.  Pt  will be able to complete SLS submerged in 3.646fup to  or >20s R/L to demonstrate ability to balance with improvement Baseline: unable to complete SLS land Goal status: Met 04/20/22  3.  Pt will have increase in r hip ROM to improve toleration to ambulation Baseline: see chart Goal status: Met 04/20/22  4.  Pt will demonstrate proper use of cane and report consistent use to decrease pain and improve mobility Baseline: not using Goal status: Ongoing 04/20/22   LONG TERM GOALS: Target date: 05/20/22  Pt will meet foto goal of 61% to demonstrate improved perception of functional mobility Baseline: 39% Goal status: INITIAL  2.  Worst pain to be < 4/10 to demonstrate improvement in condition Baseline: 8/10 Goal status: INITIAL  3.  Pt will improve hip flex and abd strength 1 full grade to demonstrate improved strength. Baseline: see chart Goal status: INITIAL  4.  Pt will improve on Berg balance test to up to or > 50/56 to demonstrate decreased fall risk Baseline: 38/56 Goal status: INITIAL  5.  Pt will improve in bilat knee strength by 1/2 grade despite chronic limiting pain. Baseline: see chart Goal status: INITIAL  6.  Pt will be Indep with land based final HEP to manage chronic conditions going forward Baseline: unmanaged LE dysfunction Goal status: INITIAL   PLAN:  PT FREQUENCY: 1-2x/week  PT DURATION: 8 weeks  PLANNED INTERVENTIONS: Therapeutic exercises, Therapeutic activity, Neuromuscular re-education, Balance training, Gait training, Patient/Family education, Self Care, Joint mobilization, Joint manipulation, Stair training, Orthotic/Fit training, DME instructions, Aquatic Therapy, Dry Needling, Electrical stimulation, Cryotherapy, Moist heat, Compression bandaging, scar mobilization, Splintting, Taping, Ultrasound, Contrast bath, Ionotophoresis 25m/ml Dexamethasone, Manual therapy, and Re-evaluation  PLAN FOR NEXT SESSION: Aquatics for LE/core strengthening, balance and  proprioceptive retraining, stair climbing, transfers, aerobic capacity. Land: strengthening, gait training, recommendations for bracing or orthotics as appropriate, HEP  MCarri Blazevich Landrie Beale MPT 04/22/22 9[facility] [address] [place] [address]Phone: [phone]  Fax:  [phone]

## 2022-04-28 ENCOUNTER — Ambulatory Visit (HOSPITAL_BASED_OUTPATIENT_CLINIC_OR_DEPARTMENT_OTHER): Payer: Medicare Other | Admitting: Physical Therapy

## 2022-04-28 ENCOUNTER — Encounter (HOSPITAL_BASED_OUTPATIENT_CLINIC_OR_DEPARTMENT_OTHER): Payer: Self-pay | Admitting: Physical Therapy

## 2022-04-28 DIAGNOSIS — R262 Difficulty in walking, not elsewhere classified: Secondary | ICD-10-CM

## 2022-04-28 DIAGNOSIS — G8929 Other chronic pain: Secondary | ICD-10-CM

## 2022-04-28 DIAGNOSIS — M25561 Pain in right knee: Secondary | ICD-10-CM | POA: Diagnosis not present

## 2022-04-28 DIAGNOSIS — M6281 Muscle weakness (generalized): Secondary | ICD-10-CM

## 2022-04-28 DIAGNOSIS — M25562 Pain in left knee: Secondary | ICD-10-CM | POA: Diagnosis not present

## 2022-04-28 NOTE — Therapy (Incomplete Revision)
OUTPATIENT PHYSICAL THERAPY LOWER EXTREMITY TREATMENT Progress Note Reporting Period 03/25/22 to 04/28/22  See note below for Objective Data and Assessment of Progress/Goals.      Patient Name: Kelly Hancock MRN: [id number] DOB:[birth year], 77 y.o., female Today's Date: 04/28/2022  END OF SESSION:  PT End of Session - 04/28/22 1035     Visit Number 10    Number of Visits 16    Date for PT Re-Evaluation 05/20/22    Authorization Type BCBS MCR    Progress Note Due on Visit 10    PT Start Time 1032    PT Stop Time 1112    PT Time Calculation (min) 40 min                Past Medical History:  Diagnosis Date   History of breast cancer 09/2015   L   Osteoarthritis    right hip   PONV (postoperative nausea and vomiting)    Past Surgical History:  Procedure Laterality Date   APPENDECTOMY     BREAST LUMPECTOMY Left 11/12/2015   BREAST LUMPECTOMY WITH RADIOACTIVE SEED AND SENTINEL LYMPH NODE BIOPSY Left 11/12/2015   Procedure: BREAST LUMPECTOMY WITH RADIOACTIVE SEED AND SENTINEL LYMPH NODE BIOPSY;  Surgeon: Excell Seltzer, MD;  Location: [facility];  Service: General;  Laterality: Left;   BREAST REDUCTION SURGERY Bilateral 11/26/2015   Procedure: MAMMARY REDUCTION  (BREAST) FOR ASYMMETRY;  Surgeon: Wallace Going, DO;  Location: [place];  Service: Plastics;  Laterality: Bilateral;   LIPOSUCTION Bilateral 11/26/2015   Procedure: LIPOSUCTION;  Surgeon: Wallace Going, DO;  Location: [facility];  Service: Plastics;  Laterality: Bilateral;   REDUCTION MAMMAPLASTY Bilateral 10/2015   TOTAL HIP ARTHROPLASTY Right    Patient Active Problem List   Diagnosis Date Noted   Mechanical knee pain, left 08/28/2020   Carcinoma of upper-outer quadrant of left female breast ([facility]) 09/05/2015   Post-traumatic osteoarthritis of right knee 08/29/2014   Shoulder dislocation, recurrent 07/29/2010   DEGENERATIVE JOINT DISEASE, LEFT  SHOULDER 03/19/2010   Osteoarthritis of ankle and foot 11/19/2009   GAD (generalized anxiety disorder) 05/12/2006   DEPRESSIVE DISORDER, NOS 05/12/2006   DIVERTICULITIS OF COLON, NOS 05/12/2006   MENOPAUSAL SYNDROME 05/12/2006    PCP: Cyndi Bender, PA-C   REFERRING PROVIDER: Stefanie Libel, MD   REFERRING DIAG:  Diagnosis  M17.11 (ICD-10-CM) - Primary osteoarthritis of right knee  M17.12 (ICD-10-CM) - Primary osteoarthritis of left knee    THERAPY DIAG:  Chronic pain of left knee  Chronic pain of right knee  Difficulty in walking, not elsewhere classified  Muscle weakness (generalized)  Rationale for Evaluation and Treatment: Rehabilitation  ONSET DATE: >10 yrs  SUBJECTIVE:   SUBJECTIVE STATEMENT: Pt reports she has more pain in her joints today.  She drove to store yesterday and pushing gas pedal really irritated her R ankle.    PERTINENT HISTORY: Right ankle fx ~ 15 yrs ago: Pt fell down steps reset it by herself did not seek medical treatment. It did not set properly. Birth: right hip dislocatred.  Had THR x35 yrs ago, not ever revised.  Some discomfort PAIN:  Are you having pain? Yes: NPRS scale:  4/10 ankle: 4/10 knee Pain location: Left knees and Right ankle Pain description: splitting pain through left knee Aggravating factors: walking Relieving factors: sitting, hot shower  PRECAUTIONS: Knee and Fall  WEIGHT BEARING RESTRICTIONS: No  FALLS:  Has patient fallen in last 6 months? No  LIVING ENVIRONMENT: Lives with: lives with their family and lives with their spouse Lives in: House/apartment Stairs: Yes: Internal: 16 steps; on left going up Has following equipment at home: Single point cane, Walker - 2 wheeled, and Crutches  OCCUPATION: retired  PLOF: Independent  PATIENT GOALS: stronger, more mobile, more physically fit, less pain  NEXT MD VISIT: 1 month  OBJECTIVE:   DIAGNOSTIC FINDINGS: bilateral knee x-rays. The right knee shows lateral  compartment advanced arthritis and grade 4 patellofemoral arthritis. The left knee shows grade 4 medial compartment arthritis and grade 4 patellofemoral arthritis   PATIENT SURVEYS:  FOTO 39% with goal of 61% at 13 weeks  04/28/22:  FOTO 52% COGNITION: Overall cognitive status: Within functional limits for tasks assessed     SENSATION: WFL  EDEMA:  N/a  MUSCLE LENGTH: WFL   POSTURE:  LE windswept  left to right  PALPATION: TTP left medial knee jpint line  LOWER EXTREMITY ROM:   R ankle fixed in slight inversion.  No movement available.  Active ROM Right eval Left eval  Hip flexion wfl   Hip extension 15   Hip abduction 30   Hip adduction 10   Hip internal rotation    Hip external rotation    Knee flexion 140 120  Knee extension 0 0  Ankle dorsiflexion 3 wfl  Ankle plantarflexion 3   Ankle inversion 3   Ankle eversion -5    (Blank rows = not tested)  LOWER EXTREMITY MMT:  MMT Right eval Left eval Right 04/28/22 Left 04/28/22  Hip flexion 4- 4+ 4 P! 4+  Hip extension      Hip abduction 5 5 5 5   Hip adduction 4 4 5 5   Hip internal rotation      Hip external rotation      Knee flexion 4-P! 4+P! 4 5-  Knee extension 4-P! 4P! 5- 5-  Ankle dorsiflexion  full    Ankle plantarflexion  full    Ankle inversion      Ankle eversion       (Blank rows = not tested)  LOWER EXTREMITY SPECIAL TESTS:  Knee special tests: Anterior drawer test: positive right  and Posterior drawer test: positive right  FUNCTIONAL TESTS:  5 times sit to stand: 10.70 Timed up and go (TUG): 9.70 Berg Balance Scale: 38/56  BERG Balance Test          Date: eval                  04/28/22 Sit to Stand 3 3  Standing unsupported 4 4  Sitting with back unsupported but feet supported 4 4  Stand to sit  3 3  Transfers  3 4  Standing unsupported with eyes closed 2 4  Standing unsupported feet together 4 4  From standing position, reach forward with outstretched arm 3 4  From standing  position, pick up object from floor 4 4  From standing position, turn and look behind over each shoulder 3 4  Turn 360 2 4  Standing unsupported, alternately place foot on step 2 3  Standing unsupported, one foot in front 0 2  Standing on one leg 1 2  Total:  38 49      GAIT: Distance walked: 429ft Assistive device utilized: None Level of assistance: Complete Independence Comments: Able to walk in stores for up past 30 mins.  Left knee valgus deformity right knee varus (wind swept). Gait antlagic Would benefit from use of cane.  Instructed Pt. She will attain and be instructed on proper use   TODAY'S TREATMENT:                                                                                                                              Pt seen for aquatic therapy today.  Treatment took place in water 3.25-4.5 ft in depth at the [facility]. Temp of water was 91.  Pt entered/exited the pool via stairs with step-to pattern with bilat rail.  * objective measures - MMT, BERG * walking forward / backward and side stepping without UE support * TrA set with solid green noodle pull down to thighs, slow eccentric return to surface x10 wide stance then staggered * holding wall: 1/2 diamonds x 10 each *straddling yellow noodle: cycling forward   Pt requires the buoyancy and hydrostatic pressure of water for support, and to offload joints by unweighting joint load by at least 50 % in navel deep water and by at least 75-80% in chest to neck deep water.  Viscosity of the water is needed for resistance of strengthening. Water current perturbations provides challenge to standing balance requiring increased core activation.  PATIENT EDUCATION:  Education details: aquatic progressions and modifications Person educated: Patient Education method: Customer service manager Education comprehension: verbalized understanding and returned demonstration  HOME EXERCISE PROGRAM: Access  Code: [email or web address] URL: [email or web address] Date: 03/25/2022 Prepared by: Denton Meek  Exercises - Supine Quad Set  - 1 x daily - 7 x weekly - 3 sets - 10 reps - Supine Heel Slide  - 1 x daily - 7 x weekly - 3 sets - 10 reps - Supine Active Straight Leg Raise  - 1 x daily - 7 x weekly - 3 sets - 10 reps  ASSESSMENT:  CLINICAL IMPRESSION: Pt unable to tolerate warrior II due to pain in Rt ankle.  Berg much improved to 49/56; near meeting LTG #4. FOTO improved. Pt participates well throughout and remains motivated to progress towards remaining goals.   Moved to deeper submersion with decreased sensitivity. Progressing well towards remaining goals.   TK:[id number] agrees with PTA.  Pt has made good functional gains as demonstrated through objective testing above. She reports continued pain although intermittent and slowly improving.    OBJECTIVE IMPAIRMENTS: Abnormal gait, decreased balance, decreased knowledge of condition, decreased knowledge of use of DME, decreased mobility, difficulty walking, decreased ROM, decreased strength, postural dysfunction, and pain.   ACTIVITY LIMITATIONS: carrying, lifting, bending, standing, squatting, stairs, transfers, and locomotion level  PARTICIPATION LIMITATIONS: meal prep, cleaning, shopping, community activity, and yard work  PERSONAL FACTORS: Age, Behavior pattern, Fitness, and Time since onset of injury/illness/exacerbation are also affecting patient's functional outcome.   REHAB POTENTIAL: Good  CLINICAL DECISION MAKING: Evolving/moderate complexity  EVALUATION COMPLEXITY: Moderate   GOALS: Goals reviewed with patient? Yes  SHORT TERM GOALS: Target date: 04/22/22 Pt will tolerate sessions of aquatic therapy without increase in pain to demonstrate positive  response to setting intervention Baseline:tba Goal status: Met 04/20/22  2.  Pt will be able to complete SLS submerged in 3.14ft up to or >20s R/L to demonstrate ability to  balance with improvement Baseline: unable to complete SLS land Goal status: Met 04/20/22  3.  Pt will have increase in r hip ROM to improve toleration to ambulation Baseline: see chart Goal status: Met 04/20/22  4.  Pt will demonstrate proper use of cane and report consistent use to decrease pain and improve mobility Baseline: not using Goal status: Partially met 04/28/22  - not using consistently, but able to use properly   LONG TERM GOALS: Target date: 05/20/22  Pt will meet foto goal of 61% to demonstrate improved perception of functional mobility Baseline: 39% eval; 52% on 04/28/22 Goal status: ONGOING   2.  Worst pain to be < 4/10 to demonstrate improvement in condition Baseline: 8/10 Goal status: ONGOING - 4/10 - varies depending on activity, 04/28/22  3.  Pt will improve hip flex and abd strength 1 full grade to demonstrate improved strength. Baseline: see chart Goal status: IN PROGRESS   4.  Pt will improve on Berg balance test to up to or > 50/56 to demonstrate decreased fall risk Baseline: 38/56 eval, 49/56 on 04/28/22 Goal status: ONGOING   5.  Pt will improve in bilat knee strength by 1/2 grade despite chronic limiting pain. Baseline: see chart Goal status: MET - 04/28/22  6.  Pt will be Indep with land based final HEP to manage chronic conditions going forward Baseline: unmanaged LE dysfunction Goal status: INITIAL   PLAN:  PT FREQUENCY: 1-2x/week  PT DURATION: 8 weeks  PLANNED INTERVENTIONS: Therapeutic exercises, Therapeutic activity, Neuromuscular re-education, Balance training, Gait training, Patient/Family education, Self Care, Joint mobilization, Joint manipulation, Stair training, Orthotic/Fit training, DME instructions, Aquatic Therapy, Dry Needling, Electrical stimulation, Cryotherapy, Moist heat, Compression bandaging, scar mobilization, Splintting, Taping, Ultrasound, Contrast bath, Ionotophoresis 4mg /ml Dexamethasone, Manual therapy, and  Re-evaluation  PLAN FOR NEXT SESSION: Aquatics for LE/core strengthening, balance and proprioceptive retraining, stair climbing, transfers, aerobic capacity. Land: strengthening, gait training, recommendations for bracing or orthotics as appropriate, HEP  Kerin Perna, PTA 04/28/22 1:30 PM [facility] [address] [place], [place], [address] Phone: [phone]   Fax:  [phone]

## 2022-04-28 NOTE — Therapy (Addendum)
OUTPATIENT PHYSICAL THERAPY LOWER EXTREMITY TREATMENT Progress Note Reporting Period 03/25/22 to 04/28/22  See note below for Objective Data and Assessment of Progress/Goals.      Patient Name: Kelly Hancock MRN: [id number] DOB:[birth year], 77 y.o., female Today's Date: 04/28/2022  END OF SESSION:  PT End of Session - 04/28/22 1035     Visit Number 10    Number of Visits 16    Date for PT Re-Evaluation 05/20/22    Authorization Type BCBS MCR    Progress Note Due on Visit 10    PT Start Time 1032    PT Stop Time 1112    PT Time Calculation (min) 40 min                Past Medical History:  Diagnosis Date   History of breast cancer 09/2015   L   Osteoarthritis    right hip   PONV (postoperative nausea and vomiting)    Past Surgical History:  Procedure Laterality Date   APPENDECTOMY     BREAST LUMPECTOMY Left 11/12/2015   BREAST LUMPECTOMY WITH RADIOACTIVE SEED AND SENTINEL LYMPH NODE BIOPSY Left 11/12/2015   Procedure: BREAST LUMPECTOMY WITH RADIOACTIVE SEED AND SENTINEL LYMPH NODE BIOPSY;  Surgeon: Excell Seltzer, MD;  Location: [place];  Service: General;  Laterality: Left;   BREAST REDUCTION SURGERY Bilateral 11/26/2015   Procedure: MAMMARY REDUCTION  (BREAST) FOR ASYMMETRY;  Surgeon: Wallace Going, DO;  Location: [place];  Service: Plastics;  Laterality: Bilateral;   LIPOSUCTION Bilateral 11/26/2015   Procedure: LIPOSUCTION;  Surgeon: Wallace Going, DO;  Location: [facility];  Service: Plastics;  Laterality: Bilateral;   REDUCTION MAMMAPLASTY Bilateral 10/2015   TOTAL HIP ARTHROPLASTY Right    Patient Active Problem List   Diagnosis Date Noted   Mechanical knee pain, left 08/28/2020   Carcinoma of upper-outer quadrant of left female breast ([place]) 09/05/2015   Post-traumatic osteoarthritis of right knee 08/29/2014   Shoulder dislocation, recurrent 07/29/2010   DEGENERATIVE JOINT DISEASE, LEFT  SHOULDER 03/19/2010   Osteoarthritis of ankle and foot 11/19/2009   GAD (generalized anxiety disorder) 05/12/2006   DEPRESSIVE DISORDER, NOS 05/12/2006   DIVERTICULITIS OF COLON, NOS 05/12/2006   MENOPAUSAL SYNDROME 05/12/2006    PCP: Cyndi Bender, PA-C   REFERRING PROVIDER: Stefanie Libel, MD   REFERRING DIAG:  Diagnosis  M17.11 (ICD-10-CM) - Primary osteoarthritis of right knee  M17.12 (ICD-10-CM) - Primary osteoarthritis of left knee    THERAPY DIAG:  Chronic pain of left knee  Chronic pain of right knee  Difficulty in walking, not elsewhere classified  Muscle weakness (generalized)  Rationale for Evaluation and Treatment: Rehabilitation  ONSET DATE: >10 yrs  SUBJECTIVE:   SUBJECTIVE STATEMENT: Pt reports she has more pain in her joints today.  She drove to store yesterday and pushing gas pedal really irritated her R ankle.    PERTINENT HISTORY: Right ankle fx ~ 15 yrs ago: Pt fell down steps reset it by herself did not seek medical treatment. It did not set properly. Birth: right hip dislocatred.  Had THR x35 yrs ago, not ever revised.  Some discomfort PAIN:  Are you having pain? Yes: NPRS scale:  4/10 ankle: 4/10 knee Pain location: Left knees and Right ankle Pain description: splitting pain through left knee Aggravating factors: walking Relieving factors: sitting, hot shower  PRECAUTIONS: Knee and Fall  WEIGHT BEARING RESTRICTIONS: No  FALLS:  Has patient fallen in last 6 months? No  LIVING ENVIRONMENT: Lives with: lives with their family and lives with their spouse Lives in: House/apartment Stairs: Yes: Internal: 16 steps; on left going up Has following equipment at home: Single point cane, Walker - 2 wheeled, and Crutches  OCCUPATION: retired  PLOF: Independent  PATIENT GOALS: stronger, more mobile, more physically fit, less pain  NEXT MD VISIT: 1 month  OBJECTIVE:   DIAGNOSTIC FINDINGS: bilateral knee x-rays. The right knee shows lateral  compartment advanced arthritis and grade 4 patellofemoral arthritis. The left knee shows grade 4 medial compartment arthritis and grade 4 patellofemoral arthritis   PATIENT SURVEYS:  FOTO 39% with goal of 61% at 13 weeks  04/28/22:  FOTO 52% COGNITION: Overall cognitive status: Within functional limits for tasks assessed     SENSATION: WFL  EDEMA:  N/a  MUSCLE LENGTH: WFL   POSTURE:  LE windswept  left to right  PALPATION: TTP left medial knee jpint line  LOWER EXTREMITY ROM:   R ankle fixed in slight inversion.  No movement available.  Active ROM Right eval Left eval  Hip flexion wfl   Hip extension 15   Hip abduction 30   Hip adduction 10   Hip internal rotation    Hip external rotation    Knee flexion 140 120  Knee extension 0 0  Ankle dorsiflexion 3 wfl  Ankle plantarflexion 3   Ankle inversion 3   Ankle eversion -5    (Blank rows = not tested)  LOWER EXTREMITY MMT:  MMT Right eval Left eval Right 04/28/22 Left 04/28/22  Hip flexion 4- 4+ 4 P! 4+  Hip extension      Hip abduction 5 5 5 5   Hip adduction 4 4 5 5   Hip internal rotation      Hip external rotation      Knee flexion 4-P! 4+P! 4 5-  Knee extension 4-P! 4P! 5- 5-  Ankle dorsiflexion  full    Ankle plantarflexion  full    Ankle inversion      Ankle eversion       (Blank rows = not tested)  LOWER EXTREMITY SPECIAL TESTS:  Knee special tests: Anterior drawer test: positive right  and Posterior drawer test: positive right  FUNCTIONAL TESTS:  5 times sit to stand: 10.70 Timed up and go (TUG): 9.70 Berg Balance Scale: 38/56  BERG Balance Test          Date: eval                  04/28/22 Sit to Stand 3 3  Standing unsupported 4 4  Sitting with back unsupported but feet supported 4 4  Stand to sit  3 3  Transfers  3 4  Standing unsupported with eyes closed 2 4  Standing unsupported feet together 4 4  From standing position, reach forward with outstretched arm 3 4  From standing  position, pick up object from floor 4 4  From standing position, turn and look behind over each shoulder 3 4  Turn 360 2 4  Standing unsupported, alternately place foot on step 2 3  Standing unsupported, one foot in front 0 2  Standing on one leg 1 2  Total:  38 49      GAIT: Distance walked: 429ft Assistive device utilized: None Level of assistance: Complete Independence Comments: Able to walk in stores for up past 30 mins.  Left knee valgus deformity right knee varus (wind swept). Gait antlagic Would benefit from use of cane.  Instructed Pt. She will attain and be instructed on proper use   TODAY'S TREATMENT:                                                                                                                              Pt seen for aquatic therapy today.  Treatment took place in water 3.25-4.5 ft in depth at the [facility]. Temp of water was 91.  Pt entered/exited the pool via stairs with step-to pattern with bilat rail.  * objective measures - MMT, BERG * walking forward / backward and side stepping without UE support * TrA set with solid green noodle pull down to thighs, slow eccentric return to surface x10 wide stance then staggered * holding wall: 1/2 diamonds x 10 each *straddling yellow noodle: cycling forward   Pt requires the buoyancy and hydrostatic pressure of water for support, and to offload joints by unweighting joint load by at least 50 % in navel deep water and by at least 75-80% in chest to neck deep water.  Viscosity of the water is needed for resistance of strengthening. Water current perturbations provides challenge to standing balance requiring increased core activation.  PATIENT EDUCATION:  Education details: aquatic progressions and modifications Person educated: Patient Education method: Customer service manager Education comprehension: verbalized understanding and returned demonstration  HOME EXERCISE PROGRAM: Access  Code: [email or web address] URL: [email or web address] Date: 03/25/2022 Prepared by: Denton Meek  Exercises - Supine Quad Set  - 1 x daily - 7 x weekly - 3 sets - 10 reps - Supine Heel Slide  - 1 x daily - 7 x weekly - 3 sets - 10 reps - Supine Active Straight Leg Raise  - 1 x daily - 7 x weekly - 3 sets - 10 reps  ASSESSMENT:  CLINICAL IMPRESSION: Pt unable to tolerate warrior II due to pain in Rt ankle.  Berg much improved to 49/56; near meeting LTG #4. FOTO improved. Pt participates well throughout and remains motivated to progress towards remaining goals.   Moved to deeper submersion with decreased sensitivity. Progressing well towards remaining goals.   TF:[id number] agrees with PTA.  Pt has made good functional gains as demonstrated through objective testing above. She reports continued pain although intermittent and slowly improving. She wiil continue to benefit from skilled PT with transition onto land to progress towards remaining goals. Final aquatic HEP to be issued and instructed.  OBJECTIVE IMPAIRMENTS: Abnormal gait, decreased balance, decreased knowledge of condition, decreased knowledge of use of DME, decreased mobility, difficulty walking, decreased ROM, decreased strength, postural dysfunction, and pain.   ACTIVITY LIMITATIONS: carrying, lifting, bending, standing, squatting, stairs, transfers, and locomotion level  PARTICIPATION LIMITATIONS: meal prep, cleaning, shopping, community activity, and yard work  PERSONAL FACTORS: Age, Behavior pattern, Fitness, and Time since onset of injury/illness/exacerbation are also affecting patient's functional outcome.   REHAB POTENTIAL: Good  CLINICAL DECISION MAKING: Evolving/moderate complexity  EVALUATION COMPLEXITY: Moderate   GOALS: Goals reviewed with  patient? Yes  SHORT TERM GOALS: Target date: 04/22/22 Pt will tolerate sessions of aquatic therapy without increase in pain to demonstrate positive response to setting  intervention Baseline:tba Goal status: Met 04/20/22  2.  Pt will be able to complete SLS submerged in 3.27ft up to or >20s R/L to demonstrate ability to balance with improvement Baseline: unable to complete SLS land Goal status: Met 04/20/22  3.  Pt will have increase in r hip ROM to improve toleration to ambulation Baseline: see chart Goal status: Met 04/20/22  4.  Pt will demonstrate proper use of cane and report consistent use to decrease pain and improve mobility Baseline: not using Goal status: Partially met 04/28/22  - not using consistently, but able to use properly   LONG TERM GOALS: Target date: 05/20/22  Pt will meet foto goal of 61% to demonstrate improved perception of functional mobility Baseline: 39% eval; 52% on 04/28/22 Goal status: ONGOING   2.  Worst pain to be < 4/10 to demonstrate improvement in condition Baseline: 8/10 Goal status: ONGOING - 4/10 - varies depending on activity, 04/28/22  3.  Pt will improve hip flex and abd strength 1 full grade to demonstrate improved strength. Baseline: see chart Goal status: IN PROGRESS   4.  Pt will improve on Berg balance test to up to or > 50/56 to demonstrate decreased fall risk Baseline: 38/56 eval, 49/56 on 04/28/22 Goal status: ONGOING   5.  Pt will improve in bilat knee strength by 1/2 grade despite chronic limiting pain. Baseline: see chart Goal status: MET - 04/28/22  6.  Pt will be Indep with land based final HEP to manage chronic conditions going forward Baseline: unmanaged LE dysfunction Goal status: INITIAL   PLAN:  PT FREQUENCY: 1-2x/week  PT DURATION: 8 weeks  PLANNED INTERVENTIONS: Therapeutic exercises, Therapeutic activity, Neuromuscular re-education, Balance training, Gait training, Patient/Family education, Self Care, Joint mobilization, Joint manipulation, Stair training, Orthotic/Fit training, DME instructions, Aquatic Therapy, Dry Needling, Electrical stimulation, Cryotherapy, Moist heat, Compression  bandaging, scar mobilization, Splintting, Taping, Ultrasound, Contrast bath, Ionotophoresis 4mg /ml Dexamethasone, Manual therapy, and Re-evaluation  PLAN FOR NEXT SESSION: Aquatics for LE/core strengthening, balance and proprioceptive retraining, stair climbing, transfers, aerobic capacity. Land: strengthening, gait training, recommendations for bracing or orthotics as appropriate, HEP  Kerin Perna, PTA 04/28/22 1:30 PM [facility] [place], [place], [address] Phone: [phone]   Fax:  [phone]   [facility]end Story Conti) Ziemba MPT 05/31/22

## 2022-04-29 ENCOUNTER — Ambulatory Visit (INDEPENDENT_AMBULATORY_CARE_PROVIDER_SITE_OTHER): Payer: Medicare Other | Admitting: Sports Medicine

## 2022-04-29 VITALS — BP 138/88 | Ht 64.0 in | Wt 151.0 lb

## 2022-04-29 DIAGNOSIS — M1712 Unilateral primary osteoarthritis, left knee: Secondary | ICD-10-CM

## 2022-04-29 DIAGNOSIS — M1711 Unilateral primary osteoarthritis, right knee: Secondary | ICD-10-CM | POA: Diagnosis not present

## 2022-04-29 DIAGNOSIS — M19071 Primary osteoarthritis, right ankle and foot: Secondary | ICD-10-CM

## 2022-04-29 NOTE — Progress Notes (Signed)
Kelly Hancock - 77 y.o. female MRN [id number]  Date of birth: Feb 25, 1946    CHIEF COMPLAINT:   bilat knee pain    SUBJECTIVE:   HPI:  Pleasant 77 year old female with a known history of bilateral knee osteoarthritis and posttraumatic osteoarthritis of right ankle comes to clinic for knee pain follow-up.  She was last seen here before Christmas.  She has been spending a lot of time out at the [facility] facility working on physical therapy, mostly in an aquatic setting.  She lives near there.  She says her knees are feeling great.  They still ache a little bit but she is letting her progress out there.  She did need a friend who mentioned PRP as a possible treatment option and she like to learn more about that today.  In regards to her right ankle that is been giving her little bit more trouble than the knees.  She has had posttraumatic arthritis in the right ankle for a number of years.  She generally wears supportive shoe wear which helps.  She has not been doing any exercises or topical treatments for this.  She has been taking Norco sparingly for this.  She is not interested in any surgical intervention the ankle.  ROS:     See HPI  PERTINENT  PMH / PSH FH / / SH:  Past Medical, Surgical, Social, and Family History Reviewed & Updated in the EMR.  Pertinent findings include:  None  OBJECTIVE: BP 138/88   Ht 5' 4"$  (1.626 m)   Wt 151 lb (68.5 kg)   BMI 25.92 kg/m   Physical Exam:  Vital signs are reviewed.  GEN: Alert and oriented, NAD Pulm: Breathing unlabored PSY: normal mood, congruent affect  MSK: Right knee -chronic squaring.  No obvious effusion.  No erythema.  Nontender to palpation at medial or lateral joint line.  Range of motion is from 0 to 100 degrees.  5/5 strength.  1+ crepitus.  She is no ligamentous instability with valgus or varus stressing.  Negative Lachman.  Negative McMurray.  Neurovascularly intact distally.  Left knee -chronic squaring.  No obvious  effusion.  No erythema.  Nontender to palpation at medial or lateral joint line.  Range of motion is from 0 to 100 degrees.  5/5 strength.  1+ crepitus.  She is no ligamentous instability with valgus or varus stressing.  Negative Lachman.  Negative McMurray.  Neurovascularly intact distally.  Right ankle -there is a large bony irregularity over the lateral malleolus.  Nontender to palpation throughout.  Range of motion in all directions limited due to arthritic changes.  Neurovascular intact distally.  ASSESSMENT & PLAN:  1.  Bilateral knee osteoarthritis -The patient was congratulated on her progress with physical therapy.  We discussed risk/benefits/pros/cons of PRP and she would like to hold off on that for now.  I do think it would be beneficial for her to get a membership at the [facility] facility where she can continue aquatic exercises on a longitudinal way.    2.  Posttraumatic osteoarthritis of right ankle -Patient would like to continue to treat this conservatively.  I recommended she continue to only use the Norco sparingly as needed for pain.  I advised her to incorporate more use of Voltaren and Arnica gel for symptomatic relief.  She is also encouraged to continue to wear shoes with good support.  We will continue to monitor this as she is uninterested in any surgical intervention.  Dortha Kern, MD  PGY-4, Sports Medicine Fellow [place]  I observed and examined the patient with the [facility] resident and agree with assessment and plan.  Note reviewed and modified by me. Ila Mcgill, MD

## 2022-05-03 ENCOUNTER — Encounter (HOSPITAL_BASED_OUTPATIENT_CLINIC_OR_DEPARTMENT_OTHER): Payer: Self-pay

## 2022-05-03 ENCOUNTER — Ambulatory Visit (HOSPITAL_BASED_OUTPATIENT_CLINIC_OR_DEPARTMENT_OTHER): Payer: Medicare Other

## 2022-05-03 DIAGNOSIS — R262 Difficulty in walking, not elsewhere classified: Secondary | ICD-10-CM

## 2022-05-03 DIAGNOSIS — G8929 Other chronic pain: Secondary | ICD-10-CM | POA: Diagnosis not present

## 2022-05-03 DIAGNOSIS — M25562 Pain in left knee: Secondary | ICD-10-CM | POA: Diagnosis not present

## 2022-05-03 DIAGNOSIS — M6281 Muscle weakness (generalized): Secondary | ICD-10-CM

## 2022-05-03 DIAGNOSIS — M25561 Pain in right knee: Secondary | ICD-10-CM | POA: Diagnosis not present

## 2022-05-03 NOTE — Therapy (Signed)
OUTPATIENT PHYSICAL THERAPY LOWER EXTREMITY TREATMENT   Patient Name: Kelly Hancock MRN: [id number] DOB:May 31, 1945, 77 y.o., female Today's Date: 05/03/2022  END OF SESSION:  PT End of Session - 05/03/22 1037     Visit Number 11    Number of Visits 16    Date for PT Re-Evaluation 05/20/22    Authorization Type BCBS MCR    Progress Note Due on Visit 20    PT Start Time 1018    PT Stop Time 1103    PT Time Calculation (min) 45 min    Activity Tolerance Patient tolerated treatment well    Behavior During Therapy [facility] for tasks assessed/performed                 Past Medical History:  Diagnosis Date   History of breast cancer 09/2015   L   Osteoarthritis    right hip   PONV (postoperative nausea and vomiting)    Past Surgical History:  Procedure Laterality Date   APPENDECTOMY     BREAST LUMPECTOMY Left 11/12/2015   BREAST LUMPECTOMY WITH RADIOACTIVE SEED AND SENTINEL LYMPH NODE BIOPSY Left 11/12/2015   Procedure: BREAST LUMPECTOMY WITH RADIOACTIVE SEED AND SENTINEL LYMPH NODE BIOPSY;  Surgeon: Excell Seltzer, MD;  Location: [facility];  Service: General;  Laterality: Left;   BREAST REDUCTION SURGERY Bilateral 11/26/2015   Procedure: MAMMARY REDUCTION  (BREAST) FOR ASYMMETRY;  Surgeon: Wallace Going, DO;  Location: [facility];  Service: Plastics;  Laterality: Bilateral;   LIPOSUCTION Bilateral 11/26/2015   Procedure: LIPOSUCTION;  Surgeon: Wallace Going, DO;  Location: [facility];  Service: Plastics;  Laterality: Bilateral;   REDUCTION MAMMAPLASTY Bilateral 10/2015   TOTAL HIP ARTHROPLASTY Right    Patient Active Problem List   Diagnosis Date Noted   Mechanical knee pain, left 08/28/2020   Carcinoma of upper-outer quadrant of left female breast ([place]) 09/05/2015   Post-traumatic osteoarthritis of right knee 08/29/2014   Shoulder dislocation, recurrent 07/29/2010   DEGENERATIVE JOINT DISEASE, LEFT SHOULDER  03/19/2010   Osteoarthritis of ankle and foot 11/19/2009   GAD (generalized anxiety disorder) 05/12/2006   DEPRESSIVE DISORDER, NOS 05/12/2006   DIVERTICULITIS OF COLON, NOS 05/12/2006   MENOPAUSAL SYNDROME 05/12/2006    PCP: Cyndi Bender, PA-C   REFERRING PROVIDER: Stefanie Libel, MD   REFERRING DIAG:  Diagnosis  M17.11 (ICD-10-CM) - Primary osteoarthritis of right knee  M17.12 (ICD-10-CM) - Primary osteoarthritis of left knee    THERAPY DIAG:  Chronic pain of left knee  Chronic pain of right knee  Difficulty in walking, not elsewhere classified  Muscle weakness (generalized)  Rationale for Evaluation and Treatment: Rehabilitation  ONSET DATE: >10 yrs  SUBJECTIVE:   SUBJECTIVE STATEMENT: Pt reports she has a lot of pain all over. Uses ice which helps. States ankle is not as bad as it can be.    PERTINENT HISTORY: Right ankle fx ~ 15 yrs ago: Pt fell down steps reset it by herself did not seek medical treatment. It did not set properly. Birth: right hip dislocatred.  Had THR x35 yrs ago, not ever revised.  Some discomfort PAIN:  Are you having pain? Yes: NPRS scale:  4/10 ankle: 4/10 knee Pain location: Left knees and Right ankle Pain description: splitting pain through left knee Aggravating factors: walking Relieving factors: sitting, hot shower  PRECAUTIONS: Knee and Fall  WEIGHT BEARING RESTRICTIONS: No  FALLS:  Has patient fallen in last 6 months? No  LIVING ENVIRONMENT:  Lives with: lives with their family and lives with their spouse Lives in: House/apartment Stairs: Yes: Internal: 16 steps; on left going up Has following equipment at home: Single point cane, Walker - 2 wheeled, and Crutches  OCCUPATION: retired  PLOF: Independent  PATIENT GOALS: stronger, more mobile, more physically fit, less pain  NEXT MD VISIT: 1 month  OBJECTIVE:   DIAGNOSTIC FINDINGS: bilateral knee x-rays. The right knee shows lateral compartment advanced arthritis and  grade 4 patellofemoral arthritis. The left knee shows grade 4 medial compartment arthritis and grade 4 patellofemoral arthritis   PATIENT SURVEYS:  FOTO 39% with goal of 61% at 13 weeks  04/28/22:  FOTO 52% COGNITION: Overall cognitive status: Within functional limits for tasks assessed     SENSATION: WFL  EDEMA:  N/a  MUSCLE LENGTH: WFL   POSTURE:  LE windswept  left to right  PALPATION: TTP left medial knee jpint line  LOWER EXTREMITY ROM:   R ankle fixed in slight inversion.  No movement available.  Active ROM Right eval Left eval  Hip flexion wfl   Hip extension 15   Hip abduction 30   Hip adduction 10   Hip internal rotation    Hip external rotation    Knee flexion 140 120  Knee extension 0 0  Ankle dorsiflexion 3 wfl  Ankle plantarflexion 3   Ankle inversion 3   Ankle eversion -5    (Blank rows = not tested)  LOWER EXTREMITY MMT:  MMT Right eval Left eval Right 04/28/22 Left 04/28/22  Hip flexion 4- 4+ 4 P! 4+  Hip extension      Hip abduction 5 5 5 5  $ Hip adduction 4 4 5 5  $ Hip internal rotation      Hip external rotation      Knee flexion 4-P! 4+P! 4 5-  Knee extension 4-P! 4P! 5- 5-  Ankle dorsiflexion  full    Ankle plantarflexion  full    Ankle inversion      Ankle eversion       (Blank rows = not tested)  LOWER EXTREMITY SPECIAL TESTS:  Knee special tests: Anterior drawer test: positive right  and Posterior drawer test: positive right  FUNCTIONAL TESTS:  5 times sit to stand: 10.70 Timed up and go (TUG): 9.70 Berg Balance Scale: 38/56  BERG Balance Test          Date: eval                  04/28/22 Sit to Stand 3 3  Standing unsupported 4 4  Sitting with back unsupported but feet supported 4 4  Stand to sit  3 3  Transfers  3 4  Standing unsupported with eyes closed 2 4  Standing unsupported feet together 4 4  From standing position, reach forward with outstretched arm 3 4  From standing position, pick up object from floor 4 4   From standing position, turn and look behind over each shoulder 3 4  Turn 360 2 4  Standing unsupported, alternately place foot on step 2 3  Standing unsupported, one foot in front 0 2  Standing on one leg 1 2  Total:  38 49      GAIT: Distance walked: 420f Assistive device utilized: None Level of assistance: Complete Independence Comments: Able to walk in stores for up past 30 mins.  Left knee valgus deformity right knee varus (wind swept). Gait antlagic Would benefit from use of cane.  Instructed  Pt. She will attain and be instructed on proper use  TODAY'S TREATMENT:    2/19: Seated toe raise 2x10 Seated heel raise 2x10 Seated march- 2x10 Seated adductor squeeze-5" hld 2x10 Seated clam GTB 2x10 Seated LAQ 3" hold 2x10ea Sit to stands 2x5 Romberg with EC- 20sec x2 Modified tandem 20sec x1ea  Supine SLR 2x5ea (Challenging due to R hip) SAQ-3" hld 2x10 bilateral PF/DF/ inv/EV on rocker board- had pain in ankle so stopped.       Previous: TODAY'S TREATMENT:                                                                                                                              Pt seen for aquatic therapy today.  Treatment took place in water 3.25-4.5 ft in depth at the [facility]. Temp of water was 91.  Pt entered/exited the pool via stairs with step-to pattern with bilat rail.  * objective measures - MMT, BERG * walking forward / backward and side stepping without UE support * TrA set with solid green noodle pull down to thighs, slow eccentric return to surface x10 wide stance then staggered * holding wall: 1/2 diamonds x 10 each *straddling yellow noodle: cycling forward   Pt requires the buoyancy and hydrostatic pressure of water for support, and to offload joints by unweighting joint load by at least 50 % in navel deep water and by at least 75-80% in chest to neck deep water.  Viscosity of the water is needed for resistance of strengthening.  Water current perturbations provides challenge to standing balance requiring increased core activation.  PATIENT EDUCATION:  Education details: aquatic progressions and modifications Person educated: Patient Education method: Customer service manager Education comprehension: verbalized understanding and returned demonstration  HOME EXERCISE PROGRAM: Access Code: [email or web address] URL: [email or web address] Date: 03/25/2022 Prepared by: Denton Meek  Exercises - Supine Quad Set  - 1 x daily - 7 x weekly - 3 sets - 10 reps - Supine Heel Slide  - 1 x daily - 7 x weekly - 3 sets - 10 reps - Supine Active Straight Leg Raise  - 1 x daily - 7 x weekly - 3 sets - 10 reps  ASSESSMENT:  CLINICAL IMPRESSION:   Initiated land based PT today with overall good tolerance. Worked on both open and closed chain strengthening. Primary limitation in R ankle pain. Also challenged with SLR due to R hip discomfort and weakness.  Overall good tolerance for light balance challenges. Educated pt about DOMS and instructed to continue with use of ice at home. Updated HEP to include gentle strengthening exercises. Will continue to monitor R ankle pain and inflammation.  OBJECTIVE IMPAIRMENTS: Abnormal gait, decreased balance, decreased knowledge of condition, decreased knowledge of use of DME, decreased mobility, difficulty walking, decreased ROM, decreased strength, postural dysfunction, and pain.   ACTIVITY LIMITATIONS: carrying, lifting, bending, standing, squatting, stairs, transfers, and locomotion level  PARTICIPATION LIMITATIONS: meal prep, cleaning, shopping, community  activity, and yard work  PERSONAL FACTORS: Age, Behavior pattern, Fitness, and Time since onset of injury/illness/exacerbation are also affecting patient's functional outcome.   REHAB POTENTIAL: Good  CLINICAL DECISION MAKING: Evolving/moderate complexity  EVALUATION COMPLEXITY: Moderate   GOALS: Goals reviewed with  patient? Yes  SHORT TERM GOALS: Target date: 04/22/22 Pt will tolerate sessions of aquatic therapy without increase in pain to demonstrate positive response to setting intervention Baseline:tba Goal status: Met 04/20/22  2.  Pt will be able to complete SLS submerged in 3.54f up to or >20s R/L to demonstrate ability to balance with improvement Baseline: unable to complete SLS land Goal status: Met 04/20/22  3.  Pt will have increase in r hip ROM to improve toleration to ambulation Baseline: see chart Goal status: Met 04/20/22  4.  Pt will demonstrate proper use of cane and report consistent use to decrease pain and improve mobility Baseline: not using Goal status: Partially met 04/28/22  - not using consistently, but able to use properly   LONG TERM GOALS: Target date: 05/20/22  Pt will meet foto goal of 61% to demonstrate improved perception of functional mobility Baseline: 39% eval; 52% on 04/28/22 Goal status: ONGOING   2.  Worst pain to be < 4/10 to demonstrate improvement in condition Baseline: 8/10 Goal status: ONGOING - 4/10 - varies depending on activity, 04/28/22  3.  Pt will improve hip flex and abd strength 1 full grade to demonstrate improved strength. Baseline: see chart Goal status: IN PROGRESS   4.  Pt will improve on Berg balance test to up to or > 50/56 to demonstrate decreased fall risk Baseline: 38/56 eval, 49/56 on 04/28/22 Goal status: ONGOING   5.  Pt will improve in bilat knee strength by 1/2 grade despite chronic limiting pain. Baseline: see chart Goal status: MET - 04/28/22  6.  Pt will be Indep with land based final HEP to manage chronic conditions going forward Baseline: unmanaged LE dysfunction Goal status: INITIAL   PLAN:  PT FREQUENCY: 1-2x/week  PT DURATION: 8 weeks  PLANNED INTERVENTIONS: Therapeutic exercises, Therapeutic activity, Neuromuscular re-education, Balance training, Gait training, Patient/Family education, Self Care, Joint mobilization,  Joint manipulation, Stair training, Orthotic/Fit training, DME instructions, Aquatic Therapy, Dry Needling, Electrical stimulation, Cryotherapy, Moist heat, Compression bandaging, scar mobilization, Splintting, Taping, Ultrasound, Contrast bath, Ionotophoresis 424mml Dexamethasone, Manual therapy, and Re-evaluation  PLAN FOR NEXT SESSION: Aquatics for LE/core strengthening, balance and proprioceptive retraining, stair climbing, transfers, aerobic capacity. Land: strengthening, gait training, recommendations for bracing or orthotics as appropriate, HEP  ReSherlynn CarbonPTA  05/03/22 12:21 PM [facility] [address] [id number]: [phone] Fax:  [phone]

## 2022-05-06 ENCOUNTER — Ambulatory Visit (HOSPITAL_BASED_OUTPATIENT_CLINIC_OR_DEPARTMENT_OTHER): Payer: Medicare Other | Admitting: Physical Therapy

## 2022-05-06 ENCOUNTER — Encounter (HOSPITAL_BASED_OUTPATIENT_CLINIC_OR_DEPARTMENT_OTHER): Payer: Self-pay | Admitting: Physical Therapy

## 2022-05-06 DIAGNOSIS — M6281 Muscle weakness (generalized): Secondary | ICD-10-CM | POA: Diagnosis not present

## 2022-05-06 DIAGNOSIS — R262 Difficulty in walking, not elsewhere classified: Secondary | ICD-10-CM | POA: Diagnosis not present

## 2022-05-06 DIAGNOSIS — G8929 Other chronic pain: Secondary | ICD-10-CM

## 2022-05-06 DIAGNOSIS — M25561 Pain in right knee: Secondary | ICD-10-CM | POA: Diagnosis not present

## 2022-05-06 DIAGNOSIS — M25562 Pain in left knee: Secondary | ICD-10-CM | POA: Diagnosis not present

## 2022-05-06 NOTE — Therapy (Signed)
OUTPATIENT PHYSICAL THERAPY LOWER EXTREMITY TREATMENT   Patient Name: Kelly Hancock MRN: [id number] DOB:13-Aug-1945, 77 y.o., female Today's Date: 05/06/2022  END OF SESSION:  PT End of Session - 05/06/22 1329     Visit Number 12    Number of Visits 16    Date for PT Re-Evaluation 05/20/22    Authorization Type BC[employer] MCR    Progress Note Due on Visit 20    PT Start Time 1033    PT Stop Time 1115    PT Time Calculation (min) 42 min    Activity Tolerance Patient tolerated treatment well    Behavior During Therapy [facility] for tasks assessed/performed                 Past Medical History:  Diagnosis Date   History of breast cancer 09/2015   L   Osteoarthritis    right hip   PONV (postoperative nausea and vomiting)    Past Surgical History:  Procedure Laterality Date   APPENDECTOMY     BREAST LUMPECTOMY Left 11/12/2015   BREAST LUMPECTOMY WITH RADIOACTIVE SEED AND SENTINEL LYMPH NODE BIOPSY Left 11/12/2015   Procedure: BREAST LUMPECTOMY WITH RADIOACTIVE SEED AND SENTINEL LYMPH NODE BIOPSY;  Surgeon: Excell Seltzer, MD;  Location: [place];  Service: General;  Laterality: Left;   BREAST REDUCTION SURGERY Bilateral 11/26/2015   Procedure: MAMMARY REDUCTION  (BREAST) FOR ASYMMETRY;  Surgeon: Wallace Going, DO;  Location: [place];  Service: Plastics;  Laterality: Bilateral;   LIPOSUCTION Bilateral 11/26/2015   Procedure: LIPOSUCTION;  Surgeon: Wallace Going, DO;  Location: [place];  Service: Plastics;  Laterality: Bilateral;   REDUCTION MAMMAPLASTY Bilateral 10/2015   TOTAL HIP ARTHROPLASTY Right    Patient Active Problem List   Diagnosis Date Noted   Mechanical knee pain, left 08/28/2020   Carcinoma of upper-outer quadrant of left female breast ([place]) 09/05/2015   Post-traumatic osteoarthritis of right knee 08/29/2014   Shoulder dislocation, recurrent 07/29/2010   DEGENERATIVE JOINT DISEASE, LEFT SHOULDER  03/19/2010   Osteoarthritis of ankle and foot 11/19/2009   GAD (generalized anxiety disorder) 05/12/2006   DEPRESSIVE DISORDER, NOS 05/12/2006   DIVERTICULITIS OF COLON, NOS 05/12/2006   MENOPAUSAL SYNDROME 05/12/2006    PCP: Cyndi Bender, PA-C   REFERRING PROVIDER: Stefanie Libel, MD   REFERRING DIAG:  Diagnosis  M17.11 (ICD-10-CM) - Primary osteoarthritis of right knee  M17.12 (ICD-10-CM) - Primary osteoarthritis of left knee    THERAPY DIAG:  Chronic pain of left knee  Chronic pain of right knee  Difficulty in walking, not elsewhere classified  Rationale for Evaluation and Treatment: Rehabilitation  ONSET DATE: >10 yrs  SUBJECTIVE:   SUBJECTIVE STATEMENT: My eyes and face are swollen I think I got stung by something outside yesterday.   PERTINENT HISTORY: Right ankle fx ~ 15 yrs ago: Pt fell down steps reset it by herself did not seek medical treatment. It did not set properly. Birth: right hip dislocatred.  Had THR x35 yrs ago, not ever revised.  Some discomfort PAIN:  Are you having pain? Yes: NPRS scale:  3.5/10 ankle: 4-5/10 knee Pain location: Left knees and Right ankle Pain description: splitting pain through left knee Aggravating factors: walking Relieving factors: sitting, hot shower  PRECAUTIONS: Knee and Fall  WEIGHT BEARING RESTRICTIONS: No  FALLS:  Has patient fallen in last 6 months? No  LIVING ENVIRONMENT: Lives with: lives with their family and lives with their spouse Lives in: House/apartment  Stairs: Yes: Internal: 16 steps; on left going up Has following equipment at home: Single point cane, Walker - 2 wheeled, and Crutches  OCCUPATION: retired  PLOF: [place]: stronger, more mobile, more physically fit, less pain  NEXT MD VISIT: 1 month  OBJECTIVE:   DIAGNOSTIC FINDINGS: bilateral knee x-rays. The right knee shows lateral compartment advanced arthritis and grade 4 patellofemoral arthritis. The left knee shows  grade 4 medial compartment arthritis and grade 4 patellofemoral arthritis   PATIENT SURVEYS:  FOTO 39% with goal of 61% at 13 weeks  04/28/22:  FOTO 52% COGNITION: Overall cognitive status: Within functional limits for tasks assessed     SENSATION: WFL  EDEMA:  N/a  MUSCLE LENGTH: WFL   POSTURE:  LE windswept  left to right  PALPATION: TTP left medial knee jpint line  LOWER EXTREMITY ROM:   R ankle fixed in slight inversion.  No movement available.  Active ROM Right eval Left eval  Hip flexion wfl   Hip extension 15   Hip abduction 30   Hip adduction 10   Hip internal rotation    Hip external rotation    Knee flexion 140 120  Knee extension 0 0  Ankle dorsiflexion 3 wfl  Ankle plantarflexion 3   Ankle inversion 3   Ankle eversion -5    (Blank rows = not tested)  LOWER EXTREMITY MMT:  MMT Right eval Left eval Right 04/28/22 Left 04/28/22  Hip flexion 4- 4+ 4 P! 4+  Hip extension      Hip abduction 5 5 5 5  $ Hip adduction 4 4 5 5  $ Hip internal rotation      Hip external rotation      Knee flexion 4-P! 4+P! 4 5-  Knee extension 4-P! 4P! 5- 5-  Ankle dorsiflexion  full    Ankle plantarflexion  full    Ankle inversion      Ankle eversion       (Blank rows = not tested)  LOWER EXTREMITY SPECIAL TESTS:  Knee special tests: Anterior drawer test: positive right  and Posterior drawer test: positive right  FUNCTIONAL TESTS:  5 times sit to stand: 10.70 Timed up and go (TUG): 9.70 Berg Balance Scale: 38/56  BERG Balance Test          Date: eval                  04/28/22 Sit to Stand 3 3  Standing unsupported 4 4  Sitting with back unsupported but feet supported 4 4  Stand to sit  3 3  Transfers  3 4  Standing unsupported with eyes closed 2 4  Standing unsupported feet together 4 4  From standing position, reach forward with outstretched arm 3 4  From standing position, pick up object from floor 4 4  From standing position, turn and look behind over  each shoulder 3 4  Turn 360 2 4  Standing unsupported, alternately place foot on step 2 3  Standing unsupported, one foot in front 0 2  Standing on one leg 1 2  Total:  38 49      GAIT: Distance walked: 437f Assistive device utilized: None Level of assistance: Complete Independence Comments: Able to walk in stores for up past 30 mins.  Left knee valgus deformity right knee varus (wind swept). Gait antlagic Would benefit from use of cane.  Instructed Pt. She will attain and be instructed on proper use   TODAY'S TREATMENT:  2/22  Pt seen for aquatic therapy today.  Treatment took place in water 3.25-4.5 ft in depth at the [facility]. Temp of water was 91.  Pt entered/exited the pool via stairs with step-to pattern with bilat rail.    * walking forward / backward and side stepping without UE support * TrA set with solid green noodle pull down to thighs, slow eccentric return to surface x10 each position wide stance then staggered *Adductor sets using BB 10 x 5s hold *STS from 3rd step with add set x8 *SLS ue unsupported: left >20s, right >20 ; repeated with 1 foam hand buoy add/abd of shoulder x 10 * holding wall: 1/2 diamonds x 10 each * Seated on bench: cycling, hip abdct/ add, flutter kick, LAQ 3 x 20 reps cues for increasing speed for added resistance    Pt requires the buoyancy and hydrostatic pressure of water for support, and to offload joints by unweighting joint load by at least 50 % in navel deep water and by at least 75-80% in chest to neck deep water.  Viscosity of the water is needed for resistance of strengthening. Water current perturbations provides challenge to standing balance requiring increased core activation.     Previous TREATMENT:    2/19: Seated toe raise 2x10 Seated heel raise 2x10 Seated march- 2x10 Seated adductor squeeze-5" hld 2x10 Seated clam GTB 2x10 Seated LAQ 3" hold 2x10ea Sit to stands 2x5 Romberg with EC- 20sec  x2 Modified tandem 20sec x1ea  Supine SLR 2x5ea (Challenging due to R hip) SAQ-3" hld 2x10 bilateral PF/DF/ inv/EV on rocker board- had pain in ankle so stopped.      PATIENT EDUCATION:  Education details: aquatic progressions and modifications Person educated: Patient Education method: Customer service manager Education comprehension: verbalized understanding and returned demonstration  HOME EXERCISE PROGRAM: Access Code: [email or web address] URL: [email or web address] Date: 03/25/2022 Prepared by: Denton Meek  Exercises - Supine Quad Set  - 1 x daily - 7 x weekly - 3 sets - 10 reps - Supine Heel Slide  - 1 x daily - 7 x weekly - 3 sets - 10 reps - Supine Active Straight Leg Raise  - 1 x daily - 7 x weekly - 3 sets - 10 reps  ASSESSMENT:  CLINICAL IMPRESSION:   Pt reports some soreness from initial land based session which passes after a few hours of movement.  She continues to be limited by right ankle pain on land as well as aquatic setting although she pushes through. She does need VC for allowing for rest periods as she tends to over work.  She demonstrates improvement in SLS today able to add ue movement which adds good challenge.  She does modify position with stance on RLE due to deformity of ankle but has formed adequate motor program to compensate.  She will be seen x 1 more aquatic based and will be issued lamenated HEP then will continue to be seen land based.    OBJECTIVE IMPAIRMENTS: Abnormal gait, decreased balance, decreased knowledge of condition, decreased knowledge of use of DME, decreased mobility, difficulty walking, decreased ROM, decreased strength, postural dysfunction, and pain.   ACTIVITY LIMITATIONS: carrying, lifting, bending, standing, squatting, stairs, transfers, and locomotion level  PARTICIPATION LIMITATIONS: meal prep, cleaning, shopping, community activity, and yard work  PERSONAL FACTORS: Age, Behavior pattern, Fitness, and Time  since onset of injury/illness/exacerbation are also affecting patient's functional outcome.   REHAB POTENTIAL: Good  CLINICAL DECISION MAKING: Evolving/moderate complexity  EVALUATION COMPLEXITY: Moderate  GOALS: Goals reviewed with patient? Yes  SHORT TERM GOALS: Target date: 04/22/22 Pt will tolerate sessions of aquatic therapy without increase in pain to demonstrate positive response to setting intervention Baseline:tba Goal status: Met 04/20/22  2.  Pt will be able to complete SLS submerged in 3.37f up to or >20s R/L to demonstrate ability to balance with improvement Baseline: unable to complete SLS land Goal status: Met 04/20/22  3.  Pt will have increase in r hip ROM to improve toleration to ambulation Baseline: see chart Goal status: Met 04/20/22  4.  Pt will demonstrate proper use of cane and report consistent use to decrease pain and improve mobility Baseline: not using Goal status: Partially met 04/28/22  - not using consistently, but able to use properly   LONG TERM GOALS: Target date: 05/20/22  Pt will meet foto goal of 61% to demonstrate improved perception of functional mobility Baseline: 39% eval; 52% on 04/28/22 Goal status: ONGOING   2.  Worst pain to be < 4/10 to demonstrate improvement in condition Baseline: 8/10 Goal status: ONGOING - 4/10 - varies depending on activity, 04/28/22  3.  Pt will improve hip flex and abd strength 1 full grade to demonstrate improved strength. Baseline: see chart Goal status: IN PROGRESS   4.  Pt will improve on Berg balance test to up to or > 50/56 to demonstrate decreased fall risk Baseline: 38/56 eval, 49/56 on 04/28/22 Goal status: ONGOING   5.  Pt will improve in bilat knee strength by 1/2 grade despite chronic limiting pain. Baseline: see chart Goal status: MET - 04/28/22  6.  Pt will be Indep with land based final HEP to manage chronic conditions going forward Baseline: unmanaged LE dysfunction Goal status:  INITIAL   PLAN:  PT FREQUENCY: 1-2x/week  PT DURATION: 8 weeks  PLANNED INTERVENTIONS: Therapeutic exercises, Therapeutic activity, Neuromuscular re-education, Balance training, Gait training, Patient/Family education, Self Care, Joint mobilization, Joint manipulation, Stair training, Orthotic/Fit training, DME instructions, Aquatic Therapy, Dry Needling, Electrical stimulation, Cryotherapy, Moist heat, Compression bandaging, scar mobilization, Splintting, Taping, Ultrasound, Contrast bath, Ionotophoresis 471mml Dexamethasone, Manual therapy, and Re-evaluation  PLAN FOR NEXT SESSION: Aquatics for LE/core strengthening, balance and proprioceptive retraining, stair climbing, transfers, aerobic capacity. Land: strengthening, gait training, recommendations for bracing or orthotics as appropriate, HEP   MaJalei StamlerZiemba MPT 05/06/22 1:30 PM [facility] [id number] [address] [id number]: [phone] Fax:  [phone]

## 2022-05-10 ENCOUNTER — Ambulatory Visit (HOSPITAL_BASED_OUTPATIENT_CLINIC_OR_DEPARTMENT_OTHER): Payer: Medicare Other

## 2022-05-10 ENCOUNTER — Encounter (HOSPITAL_BASED_OUTPATIENT_CLINIC_OR_DEPARTMENT_OTHER): Payer: Self-pay

## 2022-05-10 DIAGNOSIS — G8929 Other chronic pain: Secondary | ICD-10-CM | POA: Diagnosis not present

## 2022-05-10 DIAGNOSIS — R262 Difficulty in walking, not elsewhere classified: Secondary | ICD-10-CM | POA: Diagnosis not present

## 2022-05-10 DIAGNOSIS — M25562 Pain in left knee: Secondary | ICD-10-CM | POA: Diagnosis not present

## 2022-05-10 DIAGNOSIS — M6281 Muscle weakness (generalized): Secondary | ICD-10-CM

## 2022-05-10 DIAGNOSIS — M25561 Pain in right knee: Secondary | ICD-10-CM | POA: Diagnosis not present

## 2022-05-10 NOTE — Therapy (Signed)
OUTPATIENT PHYSICAL THERAPY LOWER EXTREMITY TREATMENT   Patient Name: Kelly Hancock MRN: [id number] DOB:11/29/1945, 77 y.o., female Today's Date: 05/10/2022  END OF SESSION:  PT End of Session - 05/10/22 1238     Visit Number 13    Number of Visits 16    Date for PT Re-Evaluation 05/20/22    Authorization Type BC[employer] MCR    Progress Note Due on Visit 20    PT Start Time 1017    PT Stop Time 1100    PT Time Calculation (min) 43 min    Activity Tolerance Patient tolerated treatment well    Behavior During Therapy [facility] for tasks assessed/performed                  Past Medical History:  Diagnosis Date   History of breast cancer 09/2015   L   Osteoarthritis    right hip   PONV (postoperative nausea and vomiting)    Past Surgical History:  Procedure Laterality Date   APPENDECTOMY     BREAST LUMPECTOMY Left 11/12/2015   BREAST LUMPECTOMY WITH RADIOACTIVE SEED AND SENTINEL LYMPH NODE BIOPSY Left 11/12/2015   Procedure: BREAST LUMPECTOMY WITH RADIOACTIVE SEED AND SENTINEL LYMPH NODE BIOPSY;  Surgeon: Excell Seltzer, MD;  Location: [place];  Service: General;  Laterality: Left;   BREAST REDUCTION SURGERY Bilateral 11/26/2015   Procedure: MAMMARY REDUCTION  (BREAST) FOR ASYMMETRY;  Surgeon: Wallace Going, DO;  Location: [place];  Service: Plastics;  Laterality: Bilateral;   LIPOSUCTION Bilateral 11/26/2015   Procedure: LIPOSUCTION;  Surgeon: Wallace Going, DO;  Location: [place];  Service: Plastics;  Laterality: Bilateral;   REDUCTION MAMMAPLASTY Bilateral 10/2015   TOTAL HIP ARTHROPLASTY Right    Patient Active Problem List   Diagnosis Date Noted   Mechanical knee pain, left 08/28/2020   Carcinoma of upper-outer quadrant of left female breast ([place]) 09/05/2015   Post-traumatic osteoarthritis of right knee 08/29/2014   Shoulder dislocation, recurrent 07/29/2010   DEGENERATIVE JOINT DISEASE, LEFT SHOULDER  03/19/2010   Osteoarthritis of ankle and foot 11/19/2009   GAD (generalized anxiety disorder) 05/12/2006   DEPRESSIVE DISORDER, NOS 05/12/2006   DIVERTICULITIS OF COLON, NOS 05/12/2006   MENOPAUSAL SYNDROME 05/12/2006    PCP: Cyndi Bender, PA-C   REFERRING PROVIDER: Stefanie Libel, MD   REFERRING DIAG:  Diagnosis  M17.11 (ICD-10-CM) - Primary osteoarthritis of right knee  M17.12 (ICD-10-CM) - Primary osteoarthritis of left knee    THERAPY DIAG:  Chronic pain of left knee  Chronic pain of right knee  Difficulty in walking, not elsewhere classified  Muscle weakness (generalized)  Rationale for Evaluation and Treatment: Rehabilitation  ONSET DATE: >10 yrs  SUBJECTIVE:   SUBJECTIVE STATEMENT: Pt reports after aquatic therapy she had sharp pains in proximal knee region. "I was doing my exercises really fast." She reports no sharp pains at entry. She arrives with Oofoos flip flops she recently purchased.    PERTINENT HISTORY: Right ankle fx ~ 15 yrs ago: Pt fell down steps reset it by herself did not seek medical treatment. It did not set properly. Birth: right hip dislocatred.  Had THR x35 yrs ago, not ever revised.  Some discomfort PAIN:  Are you having pain? Yes: NPRS scale:  3/10 ankle: 0/10 knee Pain location: Left knees and Right ankle Pain description: splitting pain through left knee Aggravating factors: walking Relieving factors: sitting, hot shower  PRECAUTIONS: Knee and Fall  WEIGHT BEARING RESTRICTIONS: No  FALLS:  Has patient fallen in last 6 months? No  LIVING ENVIRONMENT: Lives with: lives with their family and lives with their spouse Lives in: House/apartment Stairs: Yes: Internal: 16 steps; on left going up Has following equipment at home: Single point cane, Environmental consultant - 2 wheeled, and Crutches  OCCUPATION: retired  PLOF: Independent  PATIENT GOALS: stronger, more mobile, more physically fit, less pain  NEXT MD VISIT: 1 month  OBJECTIVE:    DIAGNOSTIC FINDINGS: bilateral knee x-rays. The right knee shows lateral compartment advanced arthritis and grade 4 patellofemoral arthritis. The left knee shows grade 4 medial compartment arthritis and grade 4 patellofemoral arthritis   PATIENT SURVEYS:  FOTO 39% with goal of 61% at 13 weeks  04/28/22:  FOTO 52% COGNITION: Overall cognitive status: Within functional limits for tasks assessed     SENSATION: WFL  EDEMA:  N/a  MUSCLE LENGTH: WFL   POSTURE:  LE windswept  left to right  PALPATION: TTP left medial knee jpint line  LOWER EXTREMITY ROM:   R ankle fixed in slight inversion.  No movement available.  Active ROM Right eval Left eval  Hip flexion wfl   Hip extension 15   Hip abduction 30   Hip adduction 10   Hip internal rotation    Hip external rotation    Knee flexion 140 120  Knee extension 0 0  Ankle dorsiflexion 3 wfl  Ankle plantarflexion 3   Ankle inversion 3   Ankle eversion -5    (Blank rows = not tested)  LOWER EXTREMITY MMT:  MMT Right eval Left eval Right 04/28/22 Left 04/28/22  Hip flexion 4- 4+ 4 P! 4+  Hip extension      Hip abduction '5 5 5 5  '$ Hip adduction '4 4 5 5  '$ Hip internal rotation      Hip external rotation      Knee flexion 4-P! 4+P! 4 5-  Knee extension 4-P! 4P! 5- 5-  Ankle dorsiflexion  full    Ankle plantarflexion  full    Ankle inversion      Ankle eversion       (Blank rows = not tested)  LOWER EXTREMITY SPECIAL TESTS:  Knee special tests: Anterior drawer test: positive right  and Posterior drawer test: positive right  FUNCTIONAL TESTS:  5 times sit to stand: 10.70 Timed up and go (TUG): 9.70 Berg Balance Scale: 38/56  BERG Balance Test          Date: eval                  04/28/22 Sit to Stand 3 3  Standing unsupported 4 4  Sitting with back unsupported but feet supported 4 4  Stand to sit  3 3  Transfers  3 4  Standing unsupported with eyes closed 2 4  Standing unsupported feet together 4 4  From  standing position, reach forward with outstretched arm 3 4  From standing position, pick up object from floor 4 4  From standing position, turn and look behind over each shoulder 3 4  Turn 360 2 4  Standing unsupported, alternately place foot on step 2 3  Standing unsupported, one foot in front 0 2  Standing on one leg 1 2  Total:  38 49      GAIT: Distance walked: 469f Assistive device utilized: None Level of assistance: Complete Independence Comments: Able to walk in stores for up past 30 mins.  Left knee valgus deformity right knee  varus (wind swept). Gait antlagic Would benefit from use of cane.  Instructed Pt. She will attain and be instructed on proper use   TODAY'S TREATMENT:    2/26: Seated toe raise 2x10 Seated heel raise 2x10 Seated march- 2# 2x10 Seated clam GTB 2x10 Seated LAQ 3" hold 2x10ea Sit to stands 2x5 one set with Oofos, one with Tevas Sidestepping at railing RTB at ankles  Romberg with EC- 30sec x2 Modified tandem 30sec x2ea Hip hikes x10ea (off 4" step) Supine SLR 2x5ea (Challenging due to R hip) SAQ-3" hld 2x10 bilateral    TODAY'S TREATMENT:    2/22  Pt seen for aquatic therapy today.  Treatment took place in water 3.25-4.5 ft in depth at the [facility]. Temp of water was 91.  Pt entered/exited the pool via stairs with step-to pattern with bilat rail.    * walking forward / backward and side stepping without UE support * TrA set with solid green noodle pull down to thighs, slow eccentric return to surface x10 each position wide stance then staggered *Adductor sets using BB 10 x 5s hold *STS from 3rd step with add set x8 *SLS ue unsupported: left >20s, right >20 ; repeated with 1 foam hand buoy add/abd of shoulder x 10 * holding wall: 1/2 diamonds x 10 each * Seated on bench: cycling, hip abdct/ add, flutter kick, LAQ 3 x 20 reps cues for increasing speed for added resistance    Pt requires the buoyancy and hydrostatic pressure  of water for support, and to offload joints by unweighting joint load by at least 50 % in navel deep water and by at least 75-80% in chest to neck deep water.  Viscosity of the water is needed for resistance of strengthening. Water current perturbations provides challenge to standing balance requiring increased core activation.     Previous TREATMENT:    2/19: Seated toe raise 2x10 Seated heel raise 2x10 Seated march- 2x10 Seated adductor squeeze-5" hld 2x10 Seated clam GTB 2x10 Seated LAQ 3" hold 2x10ea Sit to stands 2x5 Romberg with EC- 20sec x2 Modified tandem 20sec x1ea  Supine SLR 2x5ea (Challenging due to R hip) SAQ-3" hld 2x10 bilateral PF/DF/ inv/EV on rocker board- had pain in ankle so stopped.      PATIENT EDUCATION:  Education details: aquatic progressions and modifications Person educated: Patient Education method: Customer service manager Education comprehension: verbalized understanding and returned demonstration  HOME EXERCISE PROGRAM: Access Code: [email or web address] URL: [email or web address] Date: 03/25/2022 Prepared by: Denton Meek  Exercises - Supine Quad Set  - 1 x daily - 7 x weekly - 3 sets - 10 reps - Supine Heel Slide  - 1 x daily - 7 x weekly - 3 sets - 10 reps - Supine Active Straight Leg Raise  - 1 x daily - 7 x weekly - 3 sets - 10 reps  ASSESSMENT:  CLINICAL IMPRESSION:   Pt remains limited by R ankle and L knee with gait pattern. She demonstrates pelvic shift to L with trunk shift to R. Valgus in R knee with WB. She had improved ability for balance challenges today compared to last session, citing decrease difficulty/swaying. Performed sit to stands with both Tevas and oofos to observe any differences. Minimal difference noted aside from heel position (heel slightly off sandal medially with oofos). No complaints with LE strengthening.     OBJECTIVE IMPAIRMENTS: Abnormal gait, decreased balance, decreased knowledge of condition,  decreased knowledge of use of DME, decreased mobility, difficulty walking, decreased  ROM, decreased strength, postural dysfunction, and pain.   ACTIVITY LIMITATIONS: carrying, lifting, bending, standing, squatting, stairs, transfers, and locomotion level  PARTICIPATION LIMITATIONS: meal prep, cleaning, shopping, community activity, and yard work  PERSONAL FACTORS: Age, Behavior pattern, Fitness, and Time since onset of injury/illness/exacerbation are also affecting patient's functional outcome.   REHAB POTENTIAL: Good  CLINICAL DECISION MAKING: Evolving/moderate complexity  EVALUATION COMPLEXITY: Moderate   GOALS: Goals reviewed with patient? Yes  SHORT TERM GOALS: Target date: 04/22/22 Pt will tolerate sessions of aquatic therapy without increase in pain to demonstrate positive response to setting intervention Baseline:tba Goal status: Met 04/20/22  2.  Pt will be able to complete SLS submerged in 3.56f up to or >20s R/L to demonstrate ability to balance with improvement Baseline: unable to complete SLS land Goal status: Met 04/20/22  3.  Pt will have increase in r hip ROM to improve toleration to ambulation Baseline: see chart Goal status: Met 04/20/22  4.  Pt will demonstrate proper use of cane and report consistent use to decrease pain and improve mobility Baseline: not using Goal status: Partially met 04/28/22  - not using consistently, but able to use properly   LONG TERM GOALS: Target date: 05/20/22  Pt will meet foto goal of 61% to demonstrate improved perception of functional mobility Baseline: 39% eval; 52% on 04/28/22 Goal status: ONGOING   2.  Worst pain to be < 4/10 to demonstrate improvement in condition Baseline: 8/10 Goal status: ONGOING - 4/10 - varies depending on activity, 04/28/22  3.  Pt will improve hip flex and abd strength 1 full grade to demonstrate improved strength. Baseline: see chart Goal status: IN PROGRESS   4.  Pt will improve on Berg balance test  to up to or > 50/56 to demonstrate decreased fall risk Baseline: 38/56 eval, 49/56 on 04/28/22 Goal status: ONGOING   5.  Pt will improve in bilat knee strength by 1/2 grade despite chronic limiting pain. Baseline: see chart Goal status: MET - 04/28/22  6.  Pt will be Indep with land based final HEP to manage chronic conditions going forward Baseline: unmanaged LE dysfunction Goal status: INITIAL   PLAN:  PT FREQUENCY: 1-2x/week  PT DURATION: 8 weeks  PLANNED INTERVENTIONS: Therapeutic exercises, Therapeutic activity, Neuromuscular re-education, Balance training, Gait training, Patient/Family education, Self Care, Joint mobilization, Joint manipulation, Stair training, Orthotic/Fit training, DME instructions, Aquatic Therapy, Dry Needling, Electrical stimulation, Cryotherapy, Moist heat, Compression bandaging, scar mobilization, Splintting, Taping, Ultrasound, Contrast bath, Ionotophoresis '4mg'$ /ml Dexamethasone, Manual therapy, and Re-evaluation  PLAN FOR NEXT SESSION: Aquatics for LE/core strengthening, balance and proprioceptive retraining, stair climbing, transfers, aerobic capacity. Land: strengthening, gait training, recommendations for bracing or orthotics as appropriate, HEP   RSherlynn Carbon PTA  05/10/22 12:44 PM [facility] [address] [address]Phone: [phone]  Fax:  [phone]

## 2022-05-13 ENCOUNTER — Encounter (HOSPITAL_BASED_OUTPATIENT_CLINIC_OR_DEPARTMENT_OTHER): Payer: Self-pay | Admitting: Physical Therapy

## 2022-05-13 ENCOUNTER — Ambulatory Visit (HOSPITAL_BASED_OUTPATIENT_CLINIC_OR_DEPARTMENT_OTHER): Payer: Medicare Other | Admitting: Physical Therapy

## 2022-05-13 DIAGNOSIS — R262 Difficulty in walking, not elsewhere classified: Secondary | ICD-10-CM | POA: Diagnosis not present

## 2022-05-13 DIAGNOSIS — M6281 Muscle weakness (generalized): Secondary | ICD-10-CM

## 2022-05-13 DIAGNOSIS — M25562 Pain in left knee: Secondary | ICD-10-CM | POA: Diagnosis not present

## 2022-05-13 DIAGNOSIS — M25561 Pain in right knee: Secondary | ICD-10-CM | POA: Diagnosis not present

## 2022-05-13 DIAGNOSIS — G8929 Other chronic pain: Secondary | ICD-10-CM

## 2022-05-13 NOTE — Therapy (Signed)
OUTPATIENT PHYSICAL THERAPY LOWER EXTREMITY TREATMENT   Patient Name: Kelly Hancock MRN: [id number] DOB:[birth year], 77 y.o., female Today's Date: 05/13/2022  END OF SESSION:  PT End of Session - 05/13/22 0943     Visit Number 14    Number of Visits 16    Date for PT Re-Evaluation 05/20/22    Authorization Type BC[employer] MCR    Progress Note Due on Visit 20    PT Start Time 0946    PT Stop Time 1030    PT Time Calculation (min) 44 min    Activity Tolerance Patient tolerated treatment well    Behavior During Therapy [facility] for tasks assessed/performed                  Past Medical History:  Diagnosis Date   History of breast cancer 09/2015   L   Osteoarthritis    right hip   PONV (postoperative nausea and vomiting)    Past Surgical History:  Procedure Laterality Date   APPENDECTOMY     BREAST LUMPECTOMY Left 11/12/2015   BREAST LUMPECTOMY WITH RADIOACTIVE SEED AND SENTINEL LYMPH NODE BIOPSY Left 11/12/2015   Procedure: BREAST LUMPECTOMY WITH RADIOACTIVE SEED AND SENTINEL LYMPH NODE BIOPSY;  Surgeon: Excell Seltzer, MD;  Location: [place];  Service: General;  Laterality: Left;   BREAST REDUCTION SURGERY Bilateral 11/26/2015   Procedure: MAMMARY REDUCTION  (BREAST) FOR ASYMMETRY;  Surgeon: Wallace Going, DO;  Location: [place];  Service: Plastics;  Laterality: Bilateral;   LIPOSUCTION Bilateral 11/26/2015   Procedure: LIPOSUCTION;  Surgeon: Wallace Going, DO;  Location: [place];  Service: Plastics;  Laterality: Bilateral;   REDUCTION MAMMAPLASTY Bilateral 10/2015   TOTAL HIP ARTHROPLASTY Right    Patient Active Problem List   Diagnosis Date Noted   Mechanical knee pain, left 08/28/2020   Carcinoma of upper-outer quadrant of left female breast ([place]) 09/05/2015   Post-traumatic osteoarthritis of right knee 08/29/2014   Shoulder dislocation, recurrent 07/29/2010   DEGENERATIVE JOINT DISEASE, LEFT SHOULDER  03/19/2010   Osteoarthritis of ankle and foot 11/19/2009   GAD (generalized anxiety disorder) 05/12/2006   DEPRESSIVE DISORDER, NOS 05/12/2006   DIVERTICULITIS OF COLON, NOS 05/12/2006   MENOPAUSAL SYNDROME 05/12/2006    PCP: Cyndi Bender, PA-C   REFERRING PROVIDER: Stefanie Libel, MD   REFERRING DIAG:  Diagnosis  M17.11 (ICD-10-CM) - Primary osteoarthritis of right knee  M17.12 (ICD-10-CM) - Primary osteoarthritis of left knee    THERAPY DIAG:  Chronic pain of left knee  Chronic pain of right knee  Difficulty in walking, not elsewhere classified  Muscle weakness (generalized)  Rationale for Evaluation and Treatment: Rehabilitation  ONSET DATE: >10 yrs  SUBJECTIVE:   SUBJECTIVE STATEMENT: Pt reports increased discomfort after land based therapy but she feels she did better last session.    PERTINENT HISTORY: Right ankle fx ~ 15 yrs ago: Pt fell down steps reset it by herself did not seek medical treatment. It did not set properly. Birth: right hip dislocatred.  Had THR x35 yrs ago, not ever revised.  Some discomfort PAIN:  Are you having pain? Yes: NPRS scale:  0-1/10 ankle: 4/10 knee Pain location: Left knees and Right ankle Pain description: splitting pain through left knee Aggravating factors: walking Relieving factors: sitting, hot shower  PRECAUTIONS: Knee and Fall  WEIGHT BEARING RESTRICTIONS: No  FALLS:  Has patient fallen in last 6 months? No  LIVING ENVIRONMENT: Lives with: lives with their family and  lives with their spouse Lives in: House/apartment Stairs: Yes: Internal: 16 steps; on left going up Has following equipment at home: Single point cane, Walker - 2 wheeled, and Crutches  OCCUPATION: retired  PLOF: Independent  PATIENT GOALS: stronger, more mobile, more physically fit, less pain  NEXT MD VISIT: 1 month  OBJECTIVE:   DIAGNOSTIC FINDINGS: bilateral knee x-rays. The right knee shows lateral compartment advanced arthritis and  grade 4 patellofemoral arthritis. The left knee shows grade 4 medial compartment arthritis and grade 4 patellofemoral arthritis   PATIENT SURVEYS:  FOTO 39% with goal of 61% at 13 weeks  04/28/22:  FOTO 52% COGNITION: Overall cognitive status: Within functional limits for tasks assessed     SENSATION: WFL  EDEMA:  N/a  MUSCLE LENGTH: WFL   POSTURE:  LE windswept  left to right  PALPATION: TTP left medial knee jpint line  LOWER EXTREMITY ROM:   R ankle fixed in slight inversion.  No movement available.  Active ROM Right eval Left eval  Hip flexion wfl   Hip extension 15   Hip abduction 30   Hip adduction 10   Hip internal rotation    Hip external rotation    Knee flexion 140 120  Knee extension 0 0  Ankle dorsiflexion 3 wfl  Ankle plantarflexion 3   Ankle inversion 3   Ankle eversion -5    (Blank rows = not tested)  LOWER EXTREMITY MMT:  MMT Right eval Left eval Right 04/28/22 Left 04/28/22  Hip flexion 4- 4+ 4 P! 4+  Hip extension      Hip abduction '5 5 5 5  '$ Hip adduction '4 4 5 5  '$ Hip internal rotation      Hip external rotation      Knee flexion 4-P! 4+P! 4 5-  Knee extension 4-P! 4P! 5- 5-  Ankle dorsiflexion  full    Ankle plantarflexion  full    Ankle inversion      Ankle eversion       (Blank rows = not tested)  LOWER EXTREMITY SPECIAL TESTS:  Knee special tests: Anterior drawer test: positive right  and Posterior drawer test: positive right  FUNCTIONAL TESTS:  5 times sit to stand: 10.70 Timed up and go (TUG): 9.70 Berg Balance Scale: 38/56  BERG Balance Test          Date: eval                  04/28/22 Sit to Stand 3 3  Standing unsupported 4 4  Sitting with back unsupported but feet supported 4 4  Stand to sit  3 3  Transfers  3 4  Standing unsupported with eyes closed 2 4  Standing unsupported feet together 4 4  From standing position, reach forward with outstretched arm 3 4  From standing position, pick up object from floor 4 4   From standing position, turn and look behind over each shoulder 3 4  Turn 360 2 4  Standing unsupported, alternately place foot on step 2 3  Standing unsupported, one foot in front 0 2  Standing on one leg 1 2  Total:  38 49      GAIT: Distance walked: 460f Assistive device utilized: None Level of assistance: Complete Independence Comments: Able to walk in stores for up past 30 mins.  Left knee valgus deformity right knee varus (wind swept). Gait antlagic Would benefit from use of cane.  Instructed Pt. She will attain and be instructed  on proper use   TODAY'S TREATMENT:    Pt seen for aquatic therapy today.  Treatment took place in water 3.25-4.5 ft in depth at the [place]. Temp of water was 91.  Pt entered/exited the pool via stairs with step-to pattern with bilat rail.    * walking forward / backward and side stepping without UE support * TrA set with solid green noodle pull down to thighs, slow eccentric return to surface x10 each position wide stance then staggered *side lunges with 2 foam hand buoys ue add/abd. (With difficulty discomfort going left in ankle, adjusted step width and improved) *Adductor sets using BB 10 x 5s hold *STS from 3rd step with add set x10. Gaining immediate standing balance 10/10 x. From 4th step 4/6 times *step up leading R/L x 10 *hip hinges: vc and demonstration x 10 *SLS ue unsupported: left >20s, right >20 3.6 ft * holding wall: 1/2 diamonds x 10 each * Cycling on yellow noodle   Pt requires the buoyancy and hydrostatic pressure of water for support, and to offload joints by unweighting joint load by at least 50 % in navel deep water and by at least 75-80% in chest to neck deep water.  Viscosity of the water is needed for resistance of strengthening. Water current perturbations provides challenge to standing balance requiring increased core activation.      2/26: Seated toe raise 2x10 Seated heel raise 2x10 Seated  march- 2# 2x10 Seated clam GTB 2x10 Seated LAQ 3" hold 2x10ea Sit to stands 2x5 one set with Oofos, one with Tevas Sidestepping at railing RTB at ankles  Romberg with EC- 30sec x2 Modified tandem 30sec x2ea Hip hikes x10ea (off 4" step) Supine SLR 2x5ea (Challenging due to R hip) SAQ-3" hld 2x10 bilateral      PATIENT EDUCATION:  Education details: aquatic progressions and modifications Person educated: Patient Education method: Customer service manager Education comprehension: verbalized understanding and returned demonstration  HOME EXERCISE PROGRAM: Access Code: [email or web address] URL: [email or web address] Date: 03/25/2022 Prepared by: Denton Meek  Exercises - Supine Quad Set  - 1 x daily - 7 x weekly - 3 sets - 10 reps - Supine Heel Slide  - 1 x daily - 7 x weekly - 3 sets - 10 reps - Supine Active Straight Leg Raise  - 1 x daily - 7 x weekly - 3 sets - 10 reps  ASSESSMENT:  CLINICAL IMPRESSION:   Progressed knee strengthening with step ups and lunges.  She does report some discomfort in R ankle and L knee throughout session but able to temper with walking. Overall pain decreased today. Plan on assessment and possible re-cert end of next week.  Goals on going.     OBJECTIVE IMPAIRMENTS: Abnormal gait, decreased balance, decreased knowledge of condition, decreased knowledge of use of DME, decreased mobility, difficulty walking, decreased ROM, decreased strength, postural dysfunction, and pain.   ACTIVITY LIMITATIONS: carrying, lifting, bending, standing, squatting, stairs, transfers, and locomotion level  PARTICIPATION LIMITATIONS: meal prep, cleaning, shopping, community activity, and yard work  PERSONAL FACTORS: Age, Behavior pattern, Fitness, and Time since onset of injury/illness/exacerbation are also affecting patient's functional outcome.   REHAB POTENTIAL: Good  CLINICAL DECISION MAKING: Evolving/moderate complexity  EVALUATION COMPLEXITY:  Moderate   GOALS: Goals reviewed with patient? Yes  SHORT TERM GOALS: Target date: 04/22/22 Pt will tolerate sessions of aquatic therapy without increase in pain to demonstrate positive response to setting intervention Baseline:tba Goal status: Met 04/20/22  2.  Pt will  be able to complete SLS submerged in 3.51f up to or >20s R/L to demonstrate ability to balance with improvement Baseline: unable to complete SLS land Goal status: Met 04/20/22  3.  Pt will have increase in r hip ROM to improve toleration to ambulation Baseline: see chart Goal status: Met 04/20/22  4.  Pt will demonstrate proper use of cane and report consistent use to decrease pain and improve mobility Baseline: not using Goal status: Partially met 04/28/22  - not using consistently, but able to use properly   LONG TERM GOALS: Target date: 05/20/22  Pt will meet foto goal of 61% to demonstrate improved perception of functional mobility Baseline: 39% eval; 52% on 04/28/22 Goal status: ONGOING   2.  Worst pain to be < 4/10 to demonstrate improvement in condition Baseline: 8/10 Goal status: ONGOING - 4/10 - varies depending on activity, 04/28/22  3.  Pt will improve hip flex and abd strength 1 full grade to demonstrate improved strength. Baseline: see chart Goal status: IN PROGRESS   4.  Pt will improve on Berg balance test to up to or > 50/56 to demonstrate decreased fall risk Baseline: 38/56 eval, 49/56 on 04/28/22 Goal status: ONGOING   5.  Pt will improve in bilat knee strength by 1/2 grade despite chronic limiting pain. Baseline: see chart Goal status: MET - 04/28/22  6.  Pt will be Indep with land based final HEP to manage chronic conditions going forward Baseline: unmanaged LE dysfunction Goal status: INITIAL   PLAN:  PT FREQUENCY: 1-2x/week  PT DURATION: 8 weeks  PLANNED INTERVENTIONS: Therapeutic exercises, Therapeutic activity, Neuromuscular re-education, Balance training, Gait training, Patient/Family  education, Self Care, Joint mobilization, Joint manipulation, Stair training, Orthotic/Fit training, DME instructions, Aquatic Therapy, Dry Needling, Electrical stimulation, Cryotherapy, Moist heat, Compression bandaging, scar mobilization, Splintting, Taping, Ultrasound, Contrast bath, Ionotophoresis '4mg'$ /ml Dexamethasone, Manual therapy, and Re-evaluation  PLAN FOR NEXT SESSION: Aquatics for LE/core strengthening, balance and proprioceptive retraining, stair climbing, transfers, aerobic capacity. Land: strengthening, gait training, recommendations for bracing or orthotics as appropriate, HEP   RSherlynn Carbon PTA  05/13/22 1:46 PM [facility] [address] [place] [address]Phone: [phone]  Fax:  [phone]

## 2022-05-17 ENCOUNTER — Encounter (HOSPITAL_BASED_OUTPATIENT_CLINIC_OR_DEPARTMENT_OTHER): Payer: Self-pay

## 2022-05-17 ENCOUNTER — Ambulatory Visit (HOSPITAL_BASED_OUTPATIENT_CLINIC_OR_DEPARTMENT_OTHER): Payer: Medicare Other | Attending: Sports Medicine

## 2022-05-17 DIAGNOSIS — M6281 Muscle weakness (generalized): Secondary | ICD-10-CM | POA: Diagnosis not present

## 2022-05-17 DIAGNOSIS — M25561 Pain in right knee: Secondary | ICD-10-CM | POA: Diagnosis not present

## 2022-05-17 DIAGNOSIS — M25562 Pain in left knee: Secondary | ICD-10-CM | POA: Diagnosis not present

## 2022-05-17 DIAGNOSIS — R262 Difficulty in walking, not elsewhere classified: Secondary | ICD-10-CM | POA: Diagnosis not present

## 2022-05-17 DIAGNOSIS — G8929 Other chronic pain: Secondary | ICD-10-CM | POA: Insufficient documentation

## 2022-05-17 NOTE — Therapy (Signed)
OUTPATIENT PHYSICAL THERAPY LOWER EXTREMITY TREATMENT   Patient Name: Kelly Hancock MRN: [id number] DOB:10/22/1945, 77 y.o., female Today's Date: 05/17/2022  END OF SESSION:  PT End of Session - 05/17/22 1321     Visit Number 15    Number of Visits 16    Date for PT Re-Evaluation 05/20/22    Authorization Type BC[employer] MCR    Progress Note Due on Visit 20    PT Start Time 1015    PT Stop Time 1057    PT Time Calculation (min) 42 min    Activity Tolerance Patient tolerated treatment well    Behavior During Therapy [facility] for tasks assessed/performed                   Past Medical History:  Diagnosis Date   History of breast cancer 09/2015   L   Osteoarthritis    right hip   PONV (postoperative nausea and vomiting)    Past Surgical History:  Procedure Laterality Date   APPENDECTOMY     BREAST LUMPECTOMY Left 11/12/2015   BREAST LUMPECTOMY WITH RADIOACTIVE SEED AND SENTINEL LYMPH NODE BIOPSY Left 11/12/2015   Procedure: BREAST LUMPECTOMY WITH RADIOACTIVE SEED AND SENTINEL LYMPH NODE BIOPSY;  Surgeon: Excell Seltzer, MD;  Location: [place];  Service: General;  Laterality: Left;   BREAST REDUCTION SURGERY Bilateral 11/26/2015   Procedure: MAMMARY REDUCTION  (BREAST) FOR ASYMMETRY;  Surgeon: Wallace Going, DO;  Location: [place];  Service: Plastics;  Laterality: Bilateral;   LIPOSUCTION Bilateral 11/26/2015   Procedure: LIPOSUCTION;  Surgeon: Wallace Going, DO;  Location: [place];  Service: Plastics;  Laterality: Bilateral;   REDUCTION MAMMAPLASTY Bilateral 10/2015   TOTAL HIP ARTHROPLASTY Right    Patient Active Problem List   Diagnosis Date Noted   Mechanical knee pain, left 08/28/2020   Carcinoma of upper-outer quadrant of left female breast ([place]) 09/05/2015   Post-traumatic osteoarthritis of right knee 08/29/2014   Shoulder dislocation, recurrent 07/29/2010   DEGENERATIVE JOINT DISEASE, LEFT SHOULDER  03/19/2010   Osteoarthritis of ankle and foot 11/19/2009   GAD (generalized anxiety disorder) 05/12/2006   DEPRESSIVE DISORDER, NOS 05/12/2006   DIVERTICULITIS OF COLON, NOS 05/12/2006   MENOPAUSAL SYNDROME 05/12/2006    PCP: Cyndi Bender, PA-C   REFERRING PROVIDER: Stefanie Libel, MD   REFERRING DIAG:  Diagnosis  M17.11 (ICD-10-CM) - Primary osteoarthritis of right knee  M17.12 (ICD-10-CM) - Primary osteoarthritis of left knee    THERAPY DIAG:  Chronic pain of left knee  Chronic pain of right knee  Difficulty in walking, not elsewhere classified  Rationale for Evaluation and Treatment: Rehabilitation  ONSET DATE: >10 yrs  SUBJECTIVE:   SUBJECTIVE STATEMENT: Pt reports she did well with aquatic PT last visit, but has some increased pain today. "I think it's all the stress I've been under."   PERTINENT HISTORY: Right ankle fx ~ 15 yrs ago: Pt fell down steps reset it by herself did not seek medical treatment. It did not set properly. Birth: right hip dislocatred.  Had THR x35 yrs ago, not ever revised.  Some discomfort PAIN:  Are you having pain? Yes: NPRS scale:  0-1/10 ankle: 4/10 knee Pain location: Left knees and Right ankle Pain description: splitting pain through left knee Aggravating factors: walking Relieving factors: sitting, hot shower  PRECAUTIONS: Knee and Fall  WEIGHT BEARING RESTRICTIONS: No  FALLS:  Has patient fallen in last 6 months? No  LIVING ENVIRONMENT: Lives with:  lives with their family and lives with their spouse Lives in: House/apartment Stairs: Yes: Internal: 16 steps; on left going up Has following equipment at home: Single point cane, Walker - 2 wheeled, and Crutches  OCCUPATION: retired  PLOF: Independent  PATIENT GOALS: stronger, more mobile, more physically fit, less pain  NEXT MD VISIT: 1 month  OBJECTIVE:   DIAGNOSTIC FINDINGS: bilateral knee x-rays. The right knee shows lateral compartment advanced arthritis and  grade 4 patellofemoral arthritis. The left knee shows grade 4 medial compartment arthritis and grade 4 patellofemoral arthritis   PATIENT SURVEYS:  FOTO 39% with goal of 61% at 13 weeks  04/28/22:  FOTO 52% COGNITION: Overall cognitive status: Within functional limits for tasks assessed     SENSATION: WFL  EDEMA:  N/a  MUSCLE LENGTH: WFL   POSTURE:  LE windswept  left to right  PALPATION: TTP left medial knee jpint line  LOWER EXTREMITY ROM:   R ankle fixed in slight inversion.  No movement available.  Active ROM Right eval Left eval  Hip flexion wfl   Hip extension 15   Hip abduction 30   Hip adduction 10   Hip internal rotation    Hip external rotation    Knee flexion 140 120  Knee extension 0 0  Ankle dorsiflexion 3 wfl  Ankle plantarflexion 3   Ankle inversion 3   Ankle eversion -5    (Blank rows = not tested)  LOWER EXTREMITY MMT:  MMT Right eval Left eval Right 04/28/22 Left 04/28/22  Hip flexion 4- 4+ 4 P! 4+  Hip extension      Hip abduction '5 5 5 5  '$ Hip adduction '4 4 5 5  '$ Hip internal rotation      Hip external rotation      Knee flexion 4-P! 4+P! 4 5-  Knee extension 4-P! 4P! 5- 5-  Ankle dorsiflexion  full    Ankle plantarflexion  full    Ankle inversion      Ankle eversion       (Blank rows = not tested)  LOWER EXTREMITY SPECIAL TESTS:  Knee special tests: Anterior drawer test: positive right  and Posterior drawer test: positive right  FUNCTIONAL TESTS:  5 times sit to stand: 10.70 Timed up and go (TUG): 9.70 Berg Balance Scale: 38/56  BERG Balance Test          Date: eval                  04/28/22 Sit to Stand 3 3  Standing unsupported 4 4  Sitting with back unsupported but feet supported 4 4  Stand to sit  3 3  Transfers  3 4  Standing unsupported with eyes closed 2 4  Standing unsupported feet together 4 4  From standing position, reach forward with outstretched arm 3 4  From standing position, pick up object from floor 4 4   From standing position, turn and look behind over each shoulder 3 4  Turn 360 2 4  Standing unsupported, alternately place foot on step 2 3  Standing unsupported, one foot in front 0 2  Standing on one leg 1 2  Total:  38 49      GAIT: Distance walked: 481f Assistive device utilized: None Level of assistance: Complete Independence Comments: Able to walk in stores for up past 30 mins.  Left knee valgus deformity right knee varus (wind swept). Gait antlagic Would benefit from use of cane.  Instructed Pt. She  will attain and be instructed on proper use   TODAY'S TREATMENT:       05/17/22: Stair climbing- full flight. Reciprocal up, step to down Seated toe raise 2x10 Seated heel raise 2x10 Seated march- 2# 2x10 Seated clam BTB 2x10 Seated LAQ 2# 5" hold 2x10ea Sit to stands 2x10  Sidestepping at railing RTB at ankles x3 laps Romberg with EC- 30sec x2   Previous:  Pt seen for aquatic therapy today.  Treatment took place in water 3.25-4.5 ft in depth at the [facility]. Temp of water was 91.  Pt entered/exited the pool via stairs with step-to pattern with bilat rail.    * walking forward / backward and side stepping without UE support * TrA set with solid green noodle pull down to thighs, slow eccentric return to surface x10 each position wide stance then staggered *side lunges with 2 foam hand buoys ue add/abd. (With difficulty discomfort going left in ankle, adjusted step width and improved) *Adductor sets using BB 10 x 5s hold *STS from 3rd step with add set x10. Gaining immediate standing balance 10/10 x. From 4th step 4/6 times *step up leading R/L x 10 *hip hinges: vc and demonstration x 10 *SLS ue unsupported: left >20s, right >20 3.6 ft * holding wall: 1/2 diamonds x 10 each * Cycling on yellow noodle   Pt requires the buoyancy and hydrostatic pressure of water for support, and to offload joints by unweighting joint load by at least 50 % in navel  deep water and by at least 75-80% in chest to neck deep water.  Viscosity of the water is needed for resistance of strengthening. Water current perturbations provides challenge to standing balance requiring increased core activation.      2/26: Seated toe raise 2x10 Seated heel raise 2x10 Seated march- 2# 2x10 Seated clam GTB 2x10 Seated LAQ 3" hold 2x10ea Sit to stands 2x5 one set with [place], one with [place] Sidestepping at railing RTB at ankles  Romberg with EC- 30sec x2 Modified tandem 30sec x2ea Hip hikes x10ea (off 4" step) Supine SLR 2x5ea (Challenging due to R hip) SAQ-3" hld 2x10 bilateral      PATIENT EDUCATION:  Education details: aquatic progressions and modifications Person educated: Patient Education method: Customer service manager Education comprehension: verbalized understanding and returned demonstration  HOME EXERCISE PROGRAM: Access Code: [email or web address] URL: [email or web address] Date: 03/25/2022 Prepared by: Denton Meek  Exercises - Supine Quad Set  - 1 x daily - 7 x weekly - 3 sets - 10 reps - Supine Heel Slide  - 1 x daily - 7 x weekly - 3 sets - 10 reps - Supine Active Straight Leg Raise  - 1 x daily - 7 x weekly - 3 sets - 10 reps  ASSESSMENT:  CLINICAL IMPRESSION:   Pt would like to join [facility] after d/c from PT. She remains challenged with weightbearing tasks due to pain in L knee. She wants to be able to climb stairs to reach walking track upstairs. Trialed stair climbing in clinic today and pt was able to climb full flight with reciprocal ascent and step to pattern with descent. She is limited with stair descent due to L knee pain. Muscular fatigue reported in quads with sit to stands.    OBJECTIVE IMPAIRMENTS: Abnormal gait, decreased balance, decreased knowledge of condition, decreased knowledge of use of DME, decreased mobility, difficulty walking, decreased ROM, decreased strength, postural dysfunction, and pain.    ACTIVITY LIMITATIONS: carrying, lifting, bending, standing, squatting, stairs,  transfers, and locomotion level  PARTICIPATION LIMITATIONS: meal prep, cleaning, shopping, community activity, and yard work  PERSONAL FACTORS: Age, Behavior pattern, Fitness, and Time since onset of injury/illness/exacerbation are also affecting patient's functional outcome.   REHAB POTENTIAL: Good  CLINICAL DECISION MAKING: Evolving/moderate complexity  EVALUATION COMPLEXITY: Moderate   GOALS: Goals reviewed with patient? Yes  SHORT TERM GOALS: Target date: 04/22/22 Pt will tolerate sessions of aquatic therapy without increase in pain to demonstrate positive response to setting intervention Baseline:tba Goal status: Met 04/20/22  2.  Pt will be able to complete SLS submerged in 3.87f up to or >20s R/L to demonstrate ability to balance with improvement Baseline: unable to complete SLS land Goal status: Met 04/20/22  3.  Pt will have increase in r hip ROM to improve toleration to ambulation Baseline: see chart Goal status: Met 04/20/22  4.  Pt will demonstrate proper use of cane and report consistent use to decrease pain and improve mobility Baseline: not using Goal status: Partially met 04/28/22  - not using consistently, but able to use properly   LONG TERM GOALS: Target date: 05/20/22  Pt will meet foto goal of 61% to demonstrate improved perception of functional mobility Baseline: 39% eval; 52% on 04/28/22 Goal status: ONGOING   2.  Worst pain to be < 4/10 to demonstrate improvement in condition Baseline: 8/10 Goal status: ONGOING - 4/10 - varies depending on activity, 04/28/22  3.  Pt will improve hip flex and abd strength 1 full grade to demonstrate improved strength. Baseline: see chart Goal status: IN PROGRESS   4.  Pt will improve on Berg balance test to up to or > 50/56 to demonstrate decreased fall risk Baseline: 38/56 eval, 49/56 on 04/28/22 Goal status: ONGOING   5.  Pt will improve  in bilat knee strength by 1/2 grade despite chronic limiting pain. Baseline: see chart Goal status: MET - 04/28/22  6.  Pt will be Indep with land based final HEP to manage chronic conditions going forward Baseline: unmanaged LE dysfunction Goal status: INITIAL   PLAN:  PT FREQUENCY: 1-2x/week  PT DURATION: 8 weeks  PLANNED INTERVENTIONS: Therapeutic exercises, Therapeutic activity, Neuromuscular re-education, Balance training, Gait training, Patient/Family education, Self Care, Joint mobilization, Joint manipulation, Stair training, Orthotic/Fit training, DME instructions, Aquatic Therapy, Dry Needling, Electrical stimulation, Cryotherapy, Moist heat, Compression bandaging, scar mobilization, Splintting, Taping, Ultrasound, Contrast bath, Ionotophoresis '4mg'$ /ml Dexamethasone, Manual therapy, and Re-evaluation  PLAN FOR NEXT SESSION: Aquatics for LE/core strengthening, balance and proprioceptive retraining, stair climbing, transfers, aerobic capacity. Land: strengthening, gait training, recommendations for bracing or orthotics as appropriate, HEP   RSherlynn Carbon PTA  05/17/22 1:26 PM [facility] [address] [address]Phone: [phone]  Fax:  [phone]

## 2022-05-20 ENCOUNTER — Encounter (HOSPITAL_BASED_OUTPATIENT_CLINIC_OR_DEPARTMENT_OTHER): Payer: Self-pay | Admitting: Physical Therapy

## 2022-05-20 ENCOUNTER — Encounter (HOSPITAL_BASED_OUTPATIENT_CLINIC_OR_DEPARTMENT_OTHER): Payer: Medicare Other

## 2022-05-20 ENCOUNTER — Ambulatory Visit (HOSPITAL_BASED_OUTPATIENT_CLINIC_OR_DEPARTMENT_OTHER): Payer: Medicare Other | Admitting: Physical Therapy

## 2022-05-20 DIAGNOSIS — M6281 Muscle weakness (generalized): Secondary | ICD-10-CM | POA: Diagnosis not present

## 2022-05-20 DIAGNOSIS — R262 Difficulty in walking, not elsewhere classified: Secondary | ICD-10-CM | POA: Diagnosis not present

## 2022-05-20 DIAGNOSIS — G8929 Other chronic pain: Secondary | ICD-10-CM | POA: Diagnosis not present

## 2022-05-20 DIAGNOSIS — M25561 Pain in right knee: Secondary | ICD-10-CM | POA: Diagnosis not present

## 2022-05-20 DIAGNOSIS — M25562 Pain in left knee: Secondary | ICD-10-CM | POA: Diagnosis not present

## 2022-05-20 NOTE — Therapy (Signed)
OUTPATIENT PHYSICAL THERAPY LOWER EXTREMITY TREATMENT  Progress Note/ re-cert Reporting Period 03/25/22 to 05/20/22  See note below for Objective Data and Assessment of Progress/Goals.     Patient Name: Kelly Hancock MRN: [id number] DOB:Oct 27, 1945, 77 y.o., female Today's Date: 05/20/2022  END OF SESSION:  PT End of Session - 05/20/22 0859     Visit Number 16    Number of Visits 18    Date for PT Re-Evaluation 06/17/22    Authorization Type BC[employer] MCR    Progress Note Due on Visit 20    PT Start Time 0900    PT Stop Time 0945    PT Time Calculation (min) 45 min    Activity Tolerance Patient tolerated treatment well    Behavior During Therapy [facility] for tasks assessed/performed                   Past Medical History:  Diagnosis Date   History of breast cancer 09/2015   L   Osteoarthritis    right hip   PONV (postoperative nausea and vomiting)    Past Surgical History:  Procedure Laterality Date   APPENDECTOMY     BREAST LUMPECTOMY Left 11/12/2015   BREAST LUMPECTOMY WITH RADIOACTIVE SEED AND SENTINEL LYMPH NODE BIOPSY Left 11/12/2015   Procedure: BREAST LUMPECTOMY WITH RADIOACTIVE SEED AND SENTINEL LYMPH NODE BIOPSY;  Surgeon: Excell Seltzer, MD;  Location: [place];  Service: General;  Laterality: Left;   BREAST REDUCTION SURGERY Bilateral 11/26/2015   Procedure: MAMMARY REDUCTION  (BREAST) FOR ASYMMETRY;  Surgeon: Wallace Going, DO;  Location: [place];  Service: Plastics;  Laterality: Bilateral;   LIPOSUCTION Bilateral 11/26/2015   Procedure: LIPOSUCTION;  Surgeon: Wallace Going, DO;  Location: [place];  Service: Plastics;  Laterality: Bilateral;   REDUCTION MAMMAPLASTY Bilateral 10/2015   TOTAL HIP ARTHROPLASTY Right    Patient Active Problem List   Diagnosis Date Noted   Mechanical knee pain, left 08/28/2020   Carcinoma of upper-outer quadrant of left female breast ([place]) 09/05/2015    Post-traumatic osteoarthritis of right knee 08/29/2014   Shoulder dislocation, recurrent 07/29/2010   DEGENERATIVE JOINT DISEASE, LEFT SHOULDER 03/19/2010   Osteoarthritis of ankle and foot 11/19/2009   GAD (generalized anxiety disorder) 05/12/2006   DEPRESSIVE DISORDER, NOS 05/12/2006   DIVERTICULITIS OF COLON, NOS 05/12/2006   MENOPAUSAL SYNDROME 05/12/2006    PCP: Cyndi Bender, PA-C   REFERRING PROVIDER: Stefanie Libel, MD   REFERRING DIAG:  Diagnosis  M17.11 (ICD-10-CM) - Primary osteoarthritis of right knee  M17.12 (ICD-10-CM) - Primary osteoarthritis of left knee    THERAPY DIAG:  Chronic pain of left knee  Chronic pain of right knee  Difficulty in walking, not elsewhere classified  Muscle weakness (generalized)  Rationale for Evaluation and Treatment: Rehabilitation  ONSET DATE: >10 yrs  SUBJECTIVE:   SUBJECTIVE STATEMENT: Pt reports climbing stairs during last land based visit."   PERTINENT HISTORY: Right ankle fx ~ 15 yrs ago: Pt fell down steps reset it by herself did not seek medical treatment. It did not set properly. Birth: right hip dislocatred.  Had THR x35 yrs ago, not ever revised.  Some discomfort PAIN:  Are you having pain? Yes: NPRS scale:  0-1/10 ankle: 4/10 knee Pain location: Left knees and Right ankle Pain description: splitting pain through left knee Aggravating factors: walking Relieving factors: sitting, hot shower  PRECAUTIONS: Knee and Fall  WEIGHT BEARING RESTRICTIONS: No  FALLS:  Has patient fallen  in last 6 months? No  LIVING ENVIRONMENT: Lives with: lives with their family and lives with their spouse Lives in: House/apartment Stairs: Yes: Internal: 16 steps; on left going up Has following equipment at home: Single point cane, Environmental consultant - 2 wheeled, and Crutches  OCCUPATION: retired  PLOF: Independent  PATIENT GOALS: stronger, more mobile, more physically fit, less pain  NEXT MD VISIT: 1 month  OBJECTIVE:    DIAGNOSTIC FINDINGS: bilateral knee x-rays. The right knee shows lateral compartment advanced arthritis and grade 4 patellofemoral arthritis. The left knee shows grade 4 medial compartment arthritis and grade 4 patellofemoral arthritis   PATIENT SURVEYS:  FOTO 39% with goal of 61% at 13 weeks  04/28/22:  FOTO 52%  05/20/22:  Foto 60% COGNITION: Overall cognitive status: Within functional limits for tasks assessed     SENSATION: WFL  EDEMA:  N/a  MUSCLE LENGTH: WFL   POSTURE:  LE windswept  left to right  PALPATION: TTP left medial knee jpint line  LOWER EXTREMITY ROM:   R ankle fixed in slight inversion.  No movement available.  Active ROM Right eval Left eval Right / Left 05/20/22  Hip flexion wfl    Hip extension 15  20  Hip abduction 30  wfl  Hip adduction 10  10  Hip internal rotation     Hip external rotation     Knee flexion 140 120   Knee extension 0 0   Ankle dorsiflexion 3 wfl   Ankle plantarflexion 3    Ankle inversion 3    Ankle eversion -5     (Blank rows = not tested)  LOWER EXTREMITY MMT:  MMT Right eval Left eval Right 04/28/22 Left 04/28/22 Right / Left 05/20/22  Hip flexion 4- 4+ 4 P! 4+ 4 P! / 4+  Hip extension       Hip abduction '5 5 5 5   '$ Hip adduction '4 4 5 5   '$ Hip internal rotation       Hip external rotation       Knee flexion 4-P! 4+P! 4 5- 5  Knee extension 4-P! 4P! 5- 5- 5  Ankle dorsiflexion  full     Ankle plantarflexion  full     Ankle inversion       Ankle eversion        (Blank rows = not tested)  LOWER EXTREMITY SPECIAL TESTS:  Knee special tests: Anterior drawer test: positive right  and Posterior drawer test: positive right  FUNCTIONAL TESTS:  5 times sit to stand: 10.70 Timed up and go (TUG): 9.70 Berg Balance Scale: 38/56  BERG Balance Test          Date: eval     Re-cert                 04/28/22  05/20/22 Sit to Stand '3 3 4  '$ Standing unsupported '4 4 4  '$ Sitting with back unsupported but feet supported '4 4 4   '$ Stand to sit  '3 3 4  '$ Transfers  '3 4 4  '$ Standing unsupported with eyes closed '2 4 4  '$ Standing unsupported feet together '4 4 4  '$ From standing position, reach forward with outstretched arm '3 4 4  '$ From standing position, pick up object from floor '4 4 4  '$ From standing position, turn and look behind over each shoulder '3 4 4  '$ Turn 360 '2 4 4  '$ Standing unsupported, alternately place foot on step '2 3 4  '$ Standing unsupported, one  foot in front 0 2 4  Standing on one leg '1 2 4  '$ Total:  38 49 56      GAIT: Distance walked: 446f Assistive device utilized: None Level of assistance: Complete Independence Comments: Able to walk in stores for up past 30 mins.  Left knee valgus deformity right knee varus (wind swept). Gait antlagic Would benefit from use of cane.  Instructed Pt. She will attain and be instructed on proper use   TODAY'S TREATMENT:    Objective testing  Pt seen for aquatic therapy today.  Treatment took place [place] 3.25-4.[place] of [place] was 91.  Pt entered/exited the pool via stairs with step-to pattern with bilat rail.    * walking forward / backward and side stepping without UE support * TrA set with solid green noodle pull down to thighs, slow eccentric return to surface  *KB row x15 each position wide stance then staggered *side lunges with 2 foam hand buoys ue add/abd. (With difficulty discomfort going left in ankle, adjusted step width and improved) *Hand buoy "carry": submerged to sides forward and backward amb *hip hinges: vc and demonstration x 10   Pt requires the buoyancy and hydrostatic pressure of water for support, and to offload joints by unweighting joint load by at least 50 % in navel deep water and by at least 75-80% in chest to neck deep water.  Viscosity of the water is needed for resistance of strengthening. Water current perturbations provides challenge to standing balance requiring increased core  activation.   Previous:   05/17/22: Stair climbing- full flight. Reciprocal up, step to down Seated toe raise 2x10 Seated heel raise 2x10 Seated march- 2# 2x10 Seated clam BTB 2x10 Seated LAQ 2# 5" hold 2x10ea Sit to stands 2x10  Sidestepping at railing RTB at ankles x3 laps Romberg with EC- 30sec x2    PATIENT EDUCATION:  Education details: aquatic progressions and modifications Person educated: Patient Education method: ECustomer service managerEducation comprehension: verbalized understanding and returned demonstration  HOME EXERCISE PROGRAM: Access Code: [email or web address] URL: [email or web address] Date: 03/25/2022 Prepared by: FDenton Meek Exercises - Supine Quad Set  - 1 x daily - 7 x weekly - 3 sets - 10 reps - Supine Heel Slide  - 1 x daily - 7 x weekly - 3 sets - 10 reps - Supine Active Straight Leg Raise  - 1 x daily - 7 x weekly - 3 sets - 10 reps  ASSESSMENT:  CLINICAL IMPRESSION:   PN:  pt demonstrating improvements with strength and balance as demonstrated by objective testing. She reports decreased overall pain in right ankle and knee but does continue to have to have intermittent pain on a daily basis.  She has a decreased fall risk as she scored 56/56 on Berg.  She is indep with final land HEP but has not aquatic.Will see pt x 2 to assign and instruct for indep completion. She is planning on gaining membership to pool in next few weeks.      OBJECTIVE IMPAIRMENTS: Abnormal gait, decreased balance, decreased knowledge of condition, decreased knowledge of use of DME, decreased mobility, difficulty walking, decreased ROM, decreased strength, postural dysfunction, and pain.   ACTIVITY LIMITATIONS: carrying, lifting, bending, standing, squatting, stairs, transfers, and locomotion level  PARTICIPATION LIMITATIONS: meal prep, cleaning, shopping, community activity, and yard work  PERSONAL FACTORS: Age, Behavior pattern, Fitness, and Time since  onset of injury/illness/exacerbation are also affecting patient's functional outcome.  REHAB POTENTIAL: Good  CLINICAL DECISION MAKING: Evolving/moderate complexity  EVALUATION COMPLEXITY: Moderate   GOALS: Goals reviewed with patient? Yes  SHORT TERM GOALS: Target date: 04/22/22 Pt will tolerate sessions of aquatic therapy without increase in pain to demonstrate positive response to setting intervention Baseline:tba Goal status: Met 04/20/22  2.  Pt will be able to complete SLS submerged in 3.33f up to or >20s R/L to demonstrate ability to balance with improvement Baseline: unable to complete SLS land Goal status: Met 04/20/22  3.  Pt will have increase in r hip ROM to improve toleration to ambulation Baseline: see chart Goal status: Met 04/20/22  4.  Pt will demonstrate proper use of cane and report consistent use to decrease pain and improve mobility Baseline: not using Goal status: Partially met 04/28/22  - not using consistently, but able to use properly   LONG TERM GOALS: Target date: 05/20/22  Pt will meet foto goal of 61% to demonstrate improved perception of functional mobility Baseline: 39% eval; 52% on 04/28/22; 60% on 05/20/22 Goal status: ONGOING   2.  Worst pain to be < 4/10 to demonstrate improvement in condition Baseline: 8/10 Goal status: ONGOING - 4/10 - varies depending on activity, 04/28/22  3.  Pt will improve hip flex and abd strength 1 full grade to demonstrate improved strength. Baseline: see chart Goal status: IN PROGRESS 05/20/22  4.  Pt will improve on Berg balance test to up to or > 50/56 to demonstrate decreased fall risk Baseline: 38/56 eval, 49/56 on 04/28/22; 56/56  Goal status: Met 05/20/22  5.  Pt will improve in bilat knee strength by 1/2 grade despite chronic limiting pain. Baseline: see chart Goal status: MET - 04/28/22  6.  Pt will be Indep with land based final HEP to manage chronic conditions going forward Baseline: unmanaged LE  dysfunction Goal status: INITIAL   PLAN:  PT FREQUENCY: 1 x week  PT DURATION: 4 weeks  PLANNED INTERVENTIONS: Therapeutic exercises, Therapeutic activity, Neuromuscular re-education, Balance training, Gait training, Patient/Family education, Self Care, Joint mobilization, Joint manipulation, Stair training, Orthotic/Fit training, DME instructions, Aquatic Therapy, Dry Needling, Electrical stimulation, Cryotherapy, Moist heat, Compression bandaging, scar mobilization, Splintting, Taping, Ultrasound, Contrast bath, Ionotophoresis '4mg'$ /ml Dexamethasone, Manual therapy, and Re-evaluation  PLAN FOR NEXT SESSION: Aquatics for LE/core strengthening, balance and proprioceptive retraining, stair climbing, transfers, aerobic capacity. Land: strengthening, gait training, recommendations for bracing or orthotics as appropriate, HEP   MShanique Sveum Hiliana Eilts MPT  05/20/22 6:17 PM CSt. [facility]ansRehab Services [address]riage StreetGGulfcrest [place] [address]Phone: [phone]  Fax:  [phone]

## 2022-05-24 ENCOUNTER — Encounter (HOSPITAL_BASED_OUTPATIENT_CLINIC_OR_DEPARTMENT_OTHER): Payer: Medicare Other

## 2022-05-27 ENCOUNTER — Encounter (HOSPITAL_BASED_OUTPATIENT_CLINIC_OR_DEPARTMENT_OTHER): Payer: Medicare Other

## 2022-05-28 ENCOUNTER — Ambulatory Visit (HOSPITAL_BASED_OUTPATIENT_CLINIC_OR_DEPARTMENT_OTHER): Payer: Medicare Other | Admitting: Physical Therapy

## 2022-05-28 DIAGNOSIS — M25562 Pain in left knee: Secondary | ICD-10-CM | POA: Diagnosis not present

## 2022-05-28 DIAGNOSIS — R262 Difficulty in walking, not elsewhere classified: Secondary | ICD-10-CM

## 2022-05-28 DIAGNOSIS — M6281 Muscle weakness (generalized): Secondary | ICD-10-CM

## 2022-05-28 DIAGNOSIS — M25561 Pain in right knee: Secondary | ICD-10-CM | POA: Diagnosis not present

## 2022-05-28 DIAGNOSIS — G8929 Other chronic pain: Secondary | ICD-10-CM | POA: Diagnosis not present

## 2022-05-28 NOTE — Therapy (Signed)
OUTPATIENT PHYSICAL THERAPY LOWER EXTREMITY TREATMENT    Patient Name: Kelly Hancock MRN: [id number] DOB:01-22-1946, 77 y.o., female Today's Date: 05/28/2022  END OF SESSION:  PT End of Session - 05/28/22 1004     Visit Number 17    Number of Visits 18    Date for PT Re-Evaluation 06/17/22    Authorization Type BC[employer] MCR    Progress Note Due on Visit 20    PT Start Time 0946    PT Stop Time 1028    PT Time Calculation (min) 42 min    Activity Tolerance Patient tolerated treatment well    Behavior During Therapy [facility] for tasks assessed/performed                   Past Medical History:  Diagnosis Date   History of breast cancer 09/2015   L   Osteoarthritis    right hip   PONV (postoperative nausea and vomiting)    Past Surgical History:  Procedure Laterality Date   APPENDECTOMY     BREAST LUMPECTOMY Left 11/12/2015   BREAST LUMPECTOMY WITH RADIOACTIVE SEED AND SENTINEL LYMPH NODE BIOPSY Left 11/12/2015   Procedure: BREAST LUMPECTOMY WITH RADIOACTIVE SEED AND SENTINEL LYMPH NODE BIOPSY;  Surgeon: Excell Seltzer, MD;  Location: [place];  Service: General;  Laterality: Left;   BREAST REDUCTION SURGERY Bilateral 11/26/2015   Procedure: MAMMARY REDUCTION  (BREAST) FOR ASYMMETRY;  Surgeon: Wallace Going, DO;  Location: [place];  Service: Plastics;  Laterality: Bilateral;   LIPOSUCTION Bilateral 11/26/2015   Procedure: LIPOSUCTION;  Surgeon: Wallace Going, DO;  Location: [place];  Service: Plastics;  Laterality: Bilateral;   REDUCTION MAMMAPLASTY Bilateral 10/2015   TOTAL HIP ARTHROPLASTY Right    Patient Active Problem List   Diagnosis Date Noted   Mechanical knee pain, left 08/28/2020   Carcinoma of upper-outer quadrant of left female breast ([place]) 09/05/2015   Post-traumatic osteoarthritis of right knee 08/29/2014   Shoulder dislocation, recurrent 07/29/2010   DEGENERATIVE JOINT DISEASE, LEFT  SHOULDER 03/19/2010   Osteoarthritis of ankle and foot 11/19/2009   GAD (generalized anxiety disorder) 05/12/2006   DEPRESSIVE DISORDER, NOS 05/12/2006   DIVERTICULITIS OF COLON, NOS 05/12/2006   MENOPAUSAL SYNDROME 05/12/2006    PCP: Cyndi Bender, PA-C   REFERRING PROVIDER: Stefanie Libel, MD   REFERRING DIAG:  Diagnosis  M17.11 (ICD-10-CM) - Primary osteoarthritis of right knee  M17.12 (ICD-10-CM) - Primary osteoarthritis of left knee    THERAPY DIAG:  Chronic pain of left knee  Chronic pain of right knee  Difficulty in walking, not elsewhere classified  Muscle weakness (generalized)  Rationale for Evaluation and Treatment: Rehabilitation  ONSET DATE: >10 yrs  SUBJECTIVE:   SUBJECTIVE STATEMENT: "My balance has improved since I started coming."  Pt reports she needs to inquire about signing up at [facility] for using pool.    PERTINENT HISTORY: Right ankle fx ~ 15 yrs ago: Pt fell down steps reset it by herself did not seek medical treatment. It did not set properly. Birth: right hip dislocatred.  Had THR x35 yrs ago, not ever revised.  Some discomfort PAIN:  Are you having pain? Yes: NPRS scale:  4/10 ankle and bilat knee Pain location: see above  Pain description: splitting pain through left knee Aggravating factors: walking Relieving factors: sitting, hot shower  PRECAUTIONS: Knee and Fall  WEIGHT BEARING RESTRICTIONS: No  FALLS:  Has patient fallen in last 6 months? No  LIVING  ENVIRONMENT: Lives with: lives with their family and lives with their spouse Lives in: House/apartment Stairs: Yes: Internal: 16 steps; on left going up Has following equipment at home: Single point cane, Walker - 2 wheeled, and Crutches  OCCUPATION: retired  PLOF: Independent  PATIENT GOALS: stronger, more mobile, more physically fit, less pain  NEXT MD VISIT: 1 month  OBJECTIVE:   DIAGNOSTIC FINDINGS: bilateral knee x-rays. The right knee shows lateral compartment  advanced arthritis and grade 4 patellofemoral arthritis. The left knee shows grade 4 medial compartment arthritis and grade 4 patellofemoral arthritis   PATIENT SURVEYS:  FOTO 39% with goal of 61% at 13 weeks  04/28/22:  FOTO 52%  05/20/22:  Foto 60% COGNITION: Overall cognitive status: Within functional limits for tasks assessed     SENSATION: WFL  EDEMA:  N/a  MUSCLE LENGTH: WFL   POSTURE:  LE windswept  left to right  PALPATION: TTP left medial knee jpint line  LOWER EXTREMITY ROM:   R ankle fixed in slight inversion.  No movement available.  Active ROM Right eval Left eval Right / Left 05/20/22  Hip flexion wfl    Hip extension 15  20  Hip abduction 30  wfl  Hip adduction 10  10  Hip internal rotation     Hip external rotation     Knee flexion 140 120   Knee extension 0 0   Ankle dorsiflexion 3 wfl   Ankle plantarflexion 3    Ankle inversion 3    Ankle eversion -5     (Blank rows = not tested)  LOWER EXTREMITY MMT:  MMT Right eval Left eval Right 04/28/22 Left 04/28/22 Right / Left 05/20/22  Hip flexion 4- 4+ 4 P! 4+ 4 P! / 4+  Hip extension       Hip abduction 5 5 5 5    Hip adduction 4 4 5 5    Hip internal rotation       Hip external rotation       Knee flexion 4-P! 4+P! 4 5- 5  Knee extension 4-P! 4P! 5- 5- 5  Ankle dorsiflexion  full     Ankle plantarflexion  full     Ankle inversion       Ankle eversion        (Blank rows = not tested)  LOWER EXTREMITY SPECIAL TESTS:  Knee special tests: Anterior drawer test: positive right  and Posterior drawer test: positive right  FUNCTIONAL TESTS:  5 times sit to stand: 10.70 Timed up and go (TUG): 9.70 Berg Balance Scale: 38/56  BERG Balance Test          Date: eval     Re-cert                 04/28/22  05/20/22 Sit to Stand 3 3 4   Standing unsupported 4 4 4   Sitting with back unsupported but feet supported 4 4 4   Stand to sit  3 3 4   Transfers  3 4 4   Standing unsupported with eyes closed 2 4 4    Standing unsupported feet together 4 4 4   From standing position, reach forward with outstretched arm 3 4 4   From standing position, pick up object from floor 4 4 4   From standing position, turn and look behind over each shoulder 3 4 4   Turn 360 2 4 4   Standing unsupported, alternately place foot on step 2 3 4   Standing unsupported, one foot in front 0 2 4  Standing on one leg 1 2 4   Total:  38 49 56      GAIT: Distance walked: 460ft Assistive device utilized: None Level of assistance: Complete Independence Comments: Able to walk in stores for up past 30 mins.  Left knee valgus deformity right knee varus (wind swept). Gait antlagic Would benefit from use of cane.  Instructed Pt. She will attain and be instructed on proper use   TODAY'S TREATMENT:   Pt seen for aquatic therapy today.  Treatment took place in water 3.25-4.5 ft in depth at the [place]. Temp of water was 91.  Pt entered/exited the pool via stairs with step-to pattern with bilat rail.   * walking forward / backward and side stepping 4 laps of each * side stepping with arm addct with rainbow hand floats * "suitcase carry" with bilat rainbow hand floats at side, walking forward/backward core engaged * wide stance with horiz abdct/ addct ( hands under water) -> rainbow hand floats at surface 2 x10 * TrA set with solid green noodle pull down to thighs, slow eccentric return to surface  *Kick Board row x 20  * SLS without support x 20s * single leg clam at wall 2 x 10 *hip hinges with forward arm reach, hands on kickboard  x 10 * STS at 3rd step x 8 * straddling yellow noodle and cycling with/without breast stroke arms    Pt requires the buoyancy and hydrostatic pressure of water for support, and to offload joints by unweighting joint load by at least 50 % in navel deep water and by at least 75-80% in chest to neck deep water.  Viscosity of the water is needed for resistance of strengthening. Water  current perturbations provides challenge to standing balance requiring increased core activation.     PATIENT EDUCATION:  Education details: aquatic progressions and modifications Person educated: Patient Education method: Customer service manager Education comprehension: verbalized understanding and returned demonstration  HOME EXERCISE PROGRAM: Access Code: [email or web address] URL: [email or web address] This aquatic home exercise program from [place] utilizes pictures from land based exercises, but has been adapted prior to lamination and issuance.   ASSESSMENT:  CLINICAL IMPRESSION:   Pt had minor increase in ankle pain with SLS; relieved with change in position.  Pt issued laminated copies of water and land HEP.  Reviewd exercises from aquatic program, and added notes as needed.  .Will see pt x 1 more time to make any last changes to HEP and instruct for indep completion.   Therapist to assess any remaining goals next visit.       OBJECTIVE IMPAIRMENTS: Abnormal gait, decreased balance, decreased knowledge of condition, decreased knowledge of use of DME, decreased mobility, difficulty walking, decreased ROM, decreased strength, postural dysfunction, and pain.   ACTIVITY LIMITATIONS: carrying, lifting, bending, standing, squatting, stairs, transfers, and locomotion level  PARTICIPATION LIMITATIONS: meal prep, cleaning, shopping, community activity, and yard work  PERSONAL FACTORS: Age, Behavior pattern, Fitness, and Time since onset of injury/illness/exacerbation are also affecting patient's functional outcome.   REHAB POTENTIAL: Good  CLINICAL DECISION MAKING: Evolving/moderate complexity  EVALUATION COMPLEXITY: Moderate   GOALS: Goals reviewed with patient? Yes  SHORT TERM GOALS: Target date: 04/22/22 Pt will tolerate sessions of aquatic therapy without increase in pain to demonstrate positive response to setting intervention Baseline:tba Goal status: Met  04/20/22  2.  Pt will be able to complete SLS submerged in 3.75ft up to or >20s R/L to demonstrate ability to balance with improvement Baseline: unable to  complete SLS land Goal status: Met 04/20/22  3.  Pt will have increase in r hip ROM to improve toleration to ambulation Baseline: see chart Goal status: Met 04/20/22  4.  Pt will demonstrate proper use of cane and report consistent use to decrease pain and improve mobility Baseline: not using Goal status: Partially met 04/28/22  - not using consistently, but able to use properly   LONG TERM GOALS: Target date: 05/20/22  Pt will meet foto goal of 61% to demonstrate improved perception of functional mobility Baseline: 39% eval; 52% on 04/28/22; 60% on 05/20/22 Goal status: ONGOING   2.  Worst pain to be < 4/10 to demonstrate improvement in condition Baseline: 8/10 Goal status: ONGOING - 4/10 - varies depending on activity, 04/28/22  3.  Pt will improve hip flex and abd strength 1 full grade to demonstrate improved strength. Baseline: see chart Goal status: IN PROGRESS 05/20/22  4.  Pt will improve on Berg balance test to up to or > 50/56 to demonstrate decreased fall risk Baseline: 38/56 eval, 49/56 on 04/28/22; 56/56  Goal status: Met 05/20/22  5.  Pt will improve in bilat knee strength by 1/2 grade despite chronic limiting pain. Baseline: see chart Goal status: MET - 04/28/22  6.  Pt will be Indep with land based final HEP to manage chronic conditions going forward Baseline:  Goal status:MET  PLAN:  PT FREQUENCY: 1 x week  PT DURATION: 4 weeks  PLANNED INTERVENTIONS: Therapeutic exercises, Therapeutic activity, Neuromuscular re-education, Balance training, Gait training, Patient/Family education, Self Care, Joint mobilization, Joint manipulation, Stair training, Orthotic/Fit training, DME instructions, Aquatic Therapy, Dry Needling, Electrical stimulation, Cryotherapy, Moist heat, Compression bandaging, scar mobilization, Splintting,  Taping, Ultrasound, Contrast bath, Ionotophoresis 4mg /ml Dexamethasone, Manual therapy, and Re-evaluation  PLAN FOR NEXT SESSION: Aquatics for LE/core strengthening, balance and proprioceptive retraining, stair climbing, transfers, aerobic capacity. Land: strengthening, gait training, recommendations for bracing or orthotics as appropriate, HEP - 1 final visit  Kerin Perna, PTA 05/28/22 10:47 AM [place], [place], [address] Phone: [phone]   Fax:  [phone]

## 2022-06-01 ENCOUNTER — Encounter (HOSPITAL_BASED_OUTPATIENT_CLINIC_OR_DEPARTMENT_OTHER): Payer: Self-pay | Admitting: Physical Therapy

## 2022-06-01 ENCOUNTER — Ambulatory Visit (HOSPITAL_BASED_OUTPATIENT_CLINIC_OR_DEPARTMENT_OTHER): Payer: Medicare Other | Admitting: Physical Therapy

## 2022-06-01 DIAGNOSIS — R262 Difficulty in walking, not elsewhere classified: Secondary | ICD-10-CM | POA: Diagnosis not present

## 2022-06-01 DIAGNOSIS — M25562 Pain in left knee: Secondary | ICD-10-CM | POA: Diagnosis not present

## 2022-06-01 DIAGNOSIS — G8929 Other chronic pain: Secondary | ICD-10-CM | POA: Diagnosis not present

## 2022-06-01 DIAGNOSIS — M6281 Muscle weakness (generalized): Secondary | ICD-10-CM

## 2022-06-01 DIAGNOSIS — M25561 Pain in right knee: Secondary | ICD-10-CM | POA: Diagnosis not present

## 2022-06-01 NOTE — Therapy (Addendum)
OUTPATIENT PHYSICAL THERAPY LOWER EXTREMITY TREATMENT PHYSICAL THERAPY DISCHARGE SUMMARY  Visits from Start of Care: 18  Current functional level related to goals / functional outcomes: Indep   Remaining deficits: Chronic pain   Education / Equipment: Management of condition/ HEP   Patient agrees to discharge. Patient goals were partially met. Patient is being discharged due to maximized rehab potential.     Patient Name: Kelly Hancock MRN: [id number] DOB:[birth year], 77 y.o., female Today's Date: 06/01/2022  END OF SESSION:  PT End of Session - 06/01/22 1257     Visit Number 18    Number of Visits 18    Date for PT Re-Evaluation 06/17/22    Authorization Type BCBS MCR    Progress Note Due on Visit 20    PT Start Time 1205    PT Stop Time 1245    PT Time Calculation (min) 40 min    Activity Tolerance Patient tolerated treatment well    Behavior During Therapy [facility] for tasks assessed/performed                    Past Medical History:  Diagnosis Date   History of breast cancer 09/2015   L   Osteoarthritis    right hip   PONV (postoperative nausea and vomiting)    Past Surgical History:  Procedure Laterality Date   APPENDECTOMY     BREAST LUMPECTOMY Left 11/12/2015   BREAST LUMPECTOMY WITH RADIOACTIVE SEED AND SENTINEL LYMPH NODE BIOPSY Left 11/12/2015   Procedure: BREAST LUMPECTOMY WITH RADIOACTIVE SEED AND SENTINEL LYMPH NODE BIOPSY;  Surgeon: Glenna Fellows, MD;  Location: [facility];  Service: General;  Laterality: Left;   BREAST REDUCTION SURGERY Bilateral 11/26/2015   Procedure: MAMMARY REDUCTION  (BREAST) FOR ASYMMETRY;  Surgeon: Peggye Form, DO;  Location: [facility];  Service: Plastics;  Laterality: Bilateral;   LIPOSUCTION Bilateral 11/26/2015   Procedure: LIPOSUCTION;  Surgeon: Peggye Form, DO;  Location:  SURGERY CENTER;  Service: Plastics;  Laterality: Bilateral;   REDUCTION MAMMAPLASTY  Bilateral 10/2015   TOTAL HIP ARTHROPLASTY Right    Patient Active Problem List   Diagnosis Date Noted   Mechanical knee pain, left 08/28/2020   Carcinoma of upper-outer quadrant of left female breast (HCC) 09/05/2015   Post-traumatic osteoarthritis of right knee 08/29/2014   Shoulder dislocation, recurrent 07/29/2010   DEGENERATIVE JOINT DISEASE, LEFT SHOULDER 03/19/2010   Osteoarthritis of ankle and foot 11/19/2009   GAD (generalized anxiety disorder) 05/12/2006   DEPRESSIVE DISORDER, NOS 05/12/2006   DIVERTICULITIS OF COLON, NOS 05/12/2006   MENOPAUSAL SYNDROME 05/12/2006    PCP: Lonie Peak, PA-C   REFERRING PROVIDER: Enid Baas, MD   REFERRING DIAG:  Diagnosis  M17.11 (ICD-10-CM) - Primary osteoarthritis of right knee  M17.12 (ICD-10-CM) - Primary osteoarthritis of left knee    THERAPY DIAG:  Chronic pain of left knee  Chronic pain of right knee  Difficulty in walking, not elsewhere classified  Muscle weakness (generalized)  Rationale for Evaluation and Treatment: Rehabilitation  ONSET DATE: >10 yrs  SUBJECTIVE:   SUBJECTIVE STATEMENT:   Pt reports she feels tired today.  She plans to sign up at [facility] after today's session.    PERTINENT HISTORY: Right ankle fx ~ 15 yrs ago: Pt fell down steps reset it by herself did not seek medical treatment. It did not set properly. Birth: right hip dislocatred.  Had THR x35 yrs ago, not ever revised.  Some discomfort PAIN:  Are  you having pain? Yes: NPRS scale:  2/10 ankle and bilat knee Pain location: see above  Pain description: splitting pain through left knee Aggravating factors: walking Relieving factors: sitting, hot shower  PRECAUTIONS: Knee and Fall  WEIGHT BEARING RESTRICTIONS: No  FALLS:  Has patient fallen in last 6 months? No  LIVING ENVIRONMENT: Lives with: lives with their family and lives with their spouse Lives in: House/apartment Stairs: Yes: Internal: 16 steps; on left going up Has  following equipment at home: Single point cane, Environmental consultant - 2 wheeled, and Crutches  OCCUPATION: retired  PLOF: Independent  PATIENT GOALS: stronger, more mobile, more physically fit, less pain  NEXT MD VISIT: 1 month  OBJECTIVE:   DIAGNOSTIC FINDINGS: bilateral knee x-rays. The right knee shows lateral compartment advanced arthritis and grade 4 patellofemoral arthritis. The left knee shows grade 4 medial compartment arthritis and grade 4 patellofemoral arthritis   PATIENT SURVEYS:  FOTO 39% with goal of 61% at 13 weeks  04/28/22:  FOTO 52%  05/20/22:  Foto 60% COGNITION: Overall cognitive status: Within functional limits for tasks assessed     SENSATION: WFL  EDEMA:  N/a  MUSCLE LENGTH: WFL   POSTURE:  LE windswept  left to right  PALPATION: TTP left medial knee jpint line  LOWER EXTREMITY ROM:   R ankle fixed in slight inversion.  No movement available.  Active ROM Right eval Left eval Right / Left 05/20/22  Hip flexion wfl    Hip extension 15  20  Hip abduction 30  wfl  Hip adduction 10  10  Hip internal rotation     Hip external rotation     Knee flexion 140 120   Knee extension 0 0   Ankle dorsiflexion 3 wfl   Ankle plantarflexion 3    Ankle inversion 3    Ankle eversion -5     (Blank rows = not tested)  LOWER EXTREMITY MMT:  MMT Right eval Left eval Right 04/28/22 Left 04/28/22 Right / Left 05/20/22  Hip flexion 4- 4+ 4 P! 4+ 4 P! / 4+  Hip extension       Hip abduction 5 5 5 5    Hip adduction 4 4 5 5    Hip internal rotation       Hip external rotation       Knee flexion 4-P! 4+P! 4 5- 5  Knee extension 4-P! 4P! 5- 5- 5  Ankle dorsiflexion  full     Ankle plantarflexion  full     Ankle inversion       Ankle eversion        (Blank rows = not tested)  LOWER EXTREMITY SPECIAL TESTS:  Knee special tests: Anterior drawer test: positive right  and Posterior drawer test: positive right  FUNCTIONAL TESTS:  5 times sit to stand: 10.70 Timed up and  go (TUG): 9.70 Berg Balance Scale: 38/56  BERG Balance Test          Date: eval     Re-cert                 04/28/22  05/20/22 Sit to Stand 3 3 4   Standing unsupported 4 4 4   Sitting with back unsupported but feet supported 4 4 4   Stand to sit  3 3 4   Transfers  3 4 4   Standing unsupported with eyes closed 2 4 4   Standing unsupported feet together 4 4 4   From standing position, reach forward with outstretched arm 3 4  4  From standing position, pick up object from floor 4 4 4   From standing position, turn and look behind over each shoulder 3 4 4   Turn 360 2 4 4   Standing unsupported, alternately place foot on step 2 3 4   Standing unsupported, one foot in front 0 2 4  Standing on one leg 1 2 4   Total:  38 49 56      GAIT: Distance walked: 432ft Assistive device utilized: None Level of assistance: Complete Independence Comments: Able to walk in stores for up past 30 mins.  Left knee valgus deformity right knee varus (wind swept). Gait antlagic Would benefit from use of cane.  Instructed Pt. She will attain and be instructed on proper use   TODAY'S TREATMENT:   Pt seen for aquatic therapy today.  Treatment took place in water 3.5-4.75 ft in depth at the [facility] pool. Temp of water was 91.  Pt entered/exited the pool via stairs with step-to pattern with bilat rail.   * walking forward / backward and side stepping 2 laps of each * side stepping with arm addct with rainbow hand floats * "suitcase carry" with bilat rainbow hand floats at side, walking forward/backward core engaged * TrA set with rainbow hand float pull down (bilat/ unilat) slow eccentric return to surface  *Kick Board row x 20 in standard stance  * bilat shoulder horiz abdct/addct with rainbow floats x 10; without floats x 10 *hip hinges with forward arm reach, hands on rainbow hand floats   x 10 * SLS without support x 20s each  * one lap of walking forward  * single leg clam at wall 2 x 10 *  straddling yellow noodle and cycling with/without breast stroke arms    Pt requires the buoyancy and hydrostatic pressure of water for support, and to offload joints by unweighting joint load by at least 50 % in navel deep water and by at least 75-80% in chest to neck deep water.  Viscosity of the water is needed for resistance of strengthening. Water current perturbations provides challenge to standing balance requiring increased core activation.     PATIENT EDUCATION:  Education details: aquatic progressions and modifications Person educated: Patient Education method: Medical illustrator Education comprehension: verbalized understanding and returned demonstration  HOME EXERCISE PROGRAM: Access Code: [email or web address] URL: [email or web address] This aquatic home exercise program from [employer] utilizes pictures from land based exercises, but has been adapted prior to lamination and issuance.   ASSESSMENT:  CLINICAL IMPRESSION:   Reviewed laminated copy of water HEP, allowing pt to read from paper and asking questions as they arose.  Pt completed exercises with minimal pain. Pt needed only slight cues for improved form.  Pt has partially met her goals but is pleased with current level of function and voices readiness to d/c to HEP at this time.    OBJECTIVE IMPAIRMENTS: Abnormal gait, decreased balance, decreased knowledge of condition, decreased knowledge of use of DME, decreased mobility, difficulty walking, decreased ROM, decreased strength, postural dysfunction, and pain.   ACTIVITY LIMITATIONS: carrying, lifting, bending, standing, squatting, stairs, transfers, and locomotion level  PARTICIPATION LIMITATIONS: meal prep, cleaning, shopping, community activity, and yard work  PERSONAL FACTORS: Age, Behavior pattern, Fitness, and Time since onset of injury/illness/exacerbation are also affecting patient's functional outcome.   REHAB POTENTIAL: Good  CLINICAL  DECISION MAKING: Evolving/moderate complexity  EVALUATION COMPLEXITY: Moderate   GOALS: Goals reviewed with patient? Yes  SHORT TERM GOALS: Target date: 04/22/22 Pt will tolerate  sessions of aquatic therapy without increase in pain to demonstrate positive response to setting intervention Baseline:tba Goal status: Met 04/20/22  2.  Pt will be able to complete SLS submerged in 3.89ft up to or >20s R/L to demonstrate ability to balance with improvement Baseline: unable to complete SLS land Goal status: Met 04/20/22  3.  Pt will have increase in r hip ROM to improve toleration to ambulation Baseline: see chart Goal status: Met 04/20/22  4.  Pt will demonstrate proper use of cane and report consistent use to decrease pain and improve mobility Baseline: not using Goal status: Partially met 04/28/22  - not using consistently, but able to use properly   LONG TERM GOALS: Target date: 05/20/22  Pt will meet foto goal of 61% to demonstrate improved perception of functional mobility Baseline: 39% eval; 52% on 04/28/22; 60% on 05/20/22 Goal status: NOT MET 05/20/22  2.  Worst pain to be < 4/10 to demonstrate improvement in condition Baseline:  Goal status: NOT MET - 7-8/10 - varies depending on activity,06/01/22  3.  Pt will improve hip flex and abd strength 1 full grade to demonstrate improved strength. Baseline: see chart Goal status: NOT MET 05/20/22  4.  Pt will improve on Berg balance test to up to or > 50/56 to demonstrate decreased fall risk Baseline: 38/56 eval, 49/56 on 04/28/22; 56/56  Goal status: Met 05/20/22  5.  Pt will improve in bilat knee strength by 1/2 grade despite chronic limiting pain. Baseline: see chart Goal status: MET - 04/28/22  6.  Pt will be Indep with land based final HEP to manage chronic conditions going forward Baseline:  Goal status:MET -06/01/22  PLAN:  PT FREQUENCY: 1 x week  PT DURATION: 4 weeks  PLANNED INTERVENTIONS: Therapeutic exercises, Therapeutic  activity, Neuromuscular re-education, Balance training, Gait training, Patient/Family education, Self Care, Joint mobilization, Joint manipulation, Stair training, Orthotic/Fit training, DME instructions, Aquatic Therapy, Dry Needling, Electrical stimulation, Cryotherapy, Moist heat, Compression bandaging, scar mobilization, Splintting, Taping, Ultrasound, Contrast bath, Ionotophoresis 4mg /ml Dexamethasone, Manual therapy, and Re-evaluation  PLAN FOR NEXT SESSION: D/C to HEP today  Mayer Camel, PTA 06/01/22 12:57 PM [facility] [address] [place], [place], [address] Phone: [phone]   Fax:  [phone]  Addend Kelly Hancock) Kelly Hancock MPT 08/11/22 1129

## 2022-09-09 DIAGNOSIS — H5213 Myopia, bilateral: Secondary | ICD-10-CM | POA: Diagnosis not present

## 2022-09-15 ENCOUNTER — Other Ambulatory Visit: Payer: Self-pay

## 2022-09-15 ENCOUNTER — Ambulatory Visit: Payer: Medicare Other | Admitting: Sports Medicine

## 2022-09-15 VITALS — BP 156/96 | Ht 65.0 in | Wt 139.0 lb

## 2022-09-15 DIAGNOSIS — M25562 Pain in left knee: Secondary | ICD-10-CM | POA: Diagnosis not present

## 2022-09-15 DIAGNOSIS — M1712 Unilateral primary osteoarthritis, left knee: Secondary | ICD-10-CM

## 2022-09-15 MED ORDER — METHYLPREDNISOLONE ACETATE 40 MG/ML IJ SUSP
40.0000 mg | Freq: Once | INTRAMUSCULAR | Status: AC
  Administered 2022-09-15: 40 mg via INTRA_ARTICULAR

## 2022-09-15 NOTE — Assessment & Plan Note (Signed)
She underwent a corticosteroid injection today She should continue icing 2 days of rest of the knee Then she can resume her physical therapy We can do periodic injections if she needs it for pain relief or for large effusions

## 2022-09-15 NOTE — Progress Notes (Signed)
Chief complaint swelling and pain in her left knee  Patient has been doing well Ongoing physical therapy program at [facility] She has significant arthritis of both knees She has an old ankle fracture that was never treated and left her with arthritis of the right ankle She has had a preplacement In spite of all the things she is doing really well in a swimming program She has lost 20 pounds  She was trying to walk on the indoor track and keep up with someone who was a bit faster than her Send her left knee has been more swollen and painful She comes to see if an injection would settle down her pain and allow her to get back to activity  Physical exam Pleasant female in no acute distress BP (!) 156/96   Ht 5\' 5"  (1.651 m)   Wt 139 lb (63 kg)   BMI 23.13 kg/m  Blood pressure rechecked  Left knee exam reveals significant effusion She has limited flexion because of the swelling causing pain about 120 degrees She has chronic spurring and irregularity of the knee contour  Right knee shows some chronic arthritic changes as well  Right ankle significant arthritic changes and there is a significant shift of the subtalar joint axis  Ultrasound of the left knee There is significant spurring and loss of the joint line medially and laterally The suprapatellar pouch shows a moderate to large effusion This tracks more laterally  Procedure:  Injection of left knee at suprapatellar pouch Consent obtained and verified. Time-out conducted. Noted no overlying erythema, induration, or other signs of local infection. Skin prepped in a sterile fashion. Topical analgesic spray: Ethyl chloride. Completed without difficulty.  I used ultrasound and a lateral approach to instill medications.  Visualization of the needle in the suprapatellar pouch is obtained. Meds: 1 cc Solu-Medrol 40.  4 cc Xylocaine 1% Advised to call if fevers/chills, erythema, induration, drainage, or persistent  bleeding.

## 2022-09-20 ENCOUNTER — Ambulatory Visit: Payer: Medicare Other | Admitting: Family Medicine

## 2022-10-15 DIAGNOSIS — I10 Essential (primary) hypertension: Secondary | ICD-10-CM | POA: Diagnosis not present

## 2022-10-15 DIAGNOSIS — E7800 Pure hypercholesterolemia, unspecified: Secondary | ICD-10-CM | POA: Diagnosis not present

## 2022-10-15 DIAGNOSIS — Z9181 History of falling: Secondary | ICD-10-CM | POA: Diagnosis not present

## 2023-01-19 DIAGNOSIS — L819 Disorder of pigmentation, unspecified: Secondary | ICD-10-CM | POA: Diagnosis not present

## 2023-01-19 DIAGNOSIS — L821 Other seborrheic keratosis: Secondary | ICD-10-CM | POA: Diagnosis not present

## 2023-01-27 ENCOUNTER — Ambulatory Visit: Payer: Medicare Other | Admitting: Family Medicine

## 2023-01-27 VITALS — BP 154/100 | Ht 64.5 in | Wt 139.0 lb

## 2023-01-27 DIAGNOSIS — M1712 Unilateral primary osteoarthritis, left knee: Secondary | ICD-10-CM | POA: Diagnosis not present

## 2023-01-27 MED ORDER — METHYLPREDNISOLONE ACETATE 40 MG/ML IJ SUSP
40.0000 mg | Freq: Once | INTRAMUSCULAR | Status: AC
  Administered 2023-01-27: 40 mg via INTRA_ARTICULAR

## 2023-01-27 MED ORDER — HYDROCODONE-ACETAMINOPHEN 5-325 MG PO TABS: 1.0000 | ORAL_TABLET | Freq: Four times a day (QID) | ORAL | 0 refills | Status: AC | PRN

## 2023-01-27 NOTE — Progress Notes (Signed)
PCP: Lonie Peak, PA-C  Subjective:   HPI: Patient is a 77 y.o. female here for follow-up of left knee pain.  Last seen 09/15/2022 and was found to have significant effusion both on exam and ultrasound.  She was given an injection at the suprapatellar pouch and had improvement with this for about 3-4 months.  She is back today for repeat injection.  Her knee is still swollen.  She denies any warmth or erythema to the area.  Pain is mostly whenever she is walking and using it.  She has been doing physical therapy and exercising at [facility] which has also helped.  She was given Norco about a year ago by Dr. Darrick Penna to help with her knee pain, and she is requesting a refill.  Past Medical History:  Diagnosis Date   History of breast cancer 09/2015   L   Osteoarthritis    right hip   PONV (postoperative nausea and vomiting)     Current Outpatient Medications on File Prior to Visit  Medication Sig Dispense Refill   b complex vitamins tablet Take 1 tablet by mouth daily.     Calcium-Magnesium-Zinc 333-133-5 MG TABS Take 1 tablet by mouth daily.     cholecalciferol (VITAMIN D) 1000 units tablet Take 5,000 Units by mouth 3 (three) times a week.     mirtazapine (REMERON) 7.5 MG tablet Take 1 tablet (7.5 mg total) by mouth at bedtime. 30 tablet 1   Multiple Vitamins-Minerals (MULTIVITAMIN WITH MINERALS) tablet Take 1 tablet by mouth daily.     omega-3 acid ethyl esters (LOVAZA) 1 g capsule Take 1 capsule by mouth daily. TAKES 5000 UNITS THREE X/WK & INCREASES TO FIVE X/WK IN WINTER     tretinoin (RETIN-A) 0.05 % cream SMARTSIG:Sparingly Topical Every Night     zinc gluconate 50 MG tablet Take 50 mg by mouth daily.     No current facility-administered medications on file prior to visit.    Past Surgical History:  Procedure Laterality Date   APPENDECTOMY     BREAST LUMPECTOMY Left 11/12/2015   BREAST LUMPECTOMY WITH RADIOACTIVE SEED AND SENTINEL LYMPH NODE BIOPSY Left 11/12/2015    Procedure: BREAST LUMPECTOMY WITH RADIOACTIVE SEED AND SENTINEL LYMPH NODE BIOPSY;  Surgeon: Glenna Fellows, MD;  Location: [facility];  Service: General;  Laterality: Left;   BREAST REDUCTION SURGERY Bilateral 11/26/2015   Procedure: MAMMARY REDUCTION  (BREAST) FOR ASYMMETRY;  Surgeon: Peggye Form, DO;  Location: [facility];  Service: Plastics;  Laterality: Bilateral;   LIPOSUCTION Bilateral 11/26/2015   Procedure: LIPOSUCTION;  Surgeon: Peggye Form, DO;  Location:  SUR[facility]ERY CENTER;  Service: Plastics;  Laterality: Bilateral;   REDUCTION MAMMAPLASTY Bilateral 10/2015   TOTAL HIP ARTHROPLASTY Right     Allergies  Allergen Reactions   Demerol Anaphylaxis   Meperidine Anaphylaxis and Other (See Comments)   Meperidine Hcl Anaphylaxis   Adhesive [Tape] Other (See Comments) and Rash    SKIN IRRITATION SKIN IRRITATION   Other Other (See Comments) and Rash    SKIN IRRITATION SKIN IRRITATION    BP (!) 154/100   Ht 5' 4.5" (1.638 m)   Wt 139 lb (63 kg)   BMI 23.49 kg/m       No data to display              No data to display              Objective:  Physical Exam:  Gen:  NAD, comfortable in exam room  Left knee: No gross deformity, ecchymoses. There is a large effusion present. No TTP. FROM with normal strength. Pain with varus testing but no laxity Negative mcmurrays, positive apleys and Thessalys. NV intact distally.   Assessment & Plan:  1.  Left knee pain: Due to moderate-severe OA.  Given improvement with injection in the past, we will proceed with injection today.  Will also refill Norco 5-325 mg as needed.  Continue staying active.  Follow-up in 3 months or sooner if needed.  Left knee injection procedure note: After informed written consent timeout was performed, patient was positioned lying supine on exam table. Left knee was prepped with alcohol swab and utilizing ultrasound guidance, patient's left  knee was injected with 3:1 lidocaine: depomedrol at the suprapatellar pouch. Patient tolerated the procedure well without immediate complications.   Janeal Holmes, MD PGY-2, [facility]

## 2023-01-28 ENCOUNTER — Encounter: Payer: Self-pay | Admitting: Family Medicine

## 2023-02-07 NOTE — Progress Notes (Signed)
Called pt to discuss benefits regarding gel injections. Breakdown attached below. Pt is responsible for 20% of the medication cost, no P.A required for Orthovisc.

## 2023-04-18 DIAGNOSIS — H6122 Impacted cerumen, left ear: Secondary | ICD-10-CM | POA: Diagnosis not present

## 2023-04-18 DIAGNOSIS — I10 Essential (primary) hypertension: Secondary | ICD-10-CM | POA: Diagnosis not present

## 2023-04-18 DIAGNOSIS — G4700 Insomnia, unspecified: Secondary | ICD-10-CM | POA: Diagnosis not present

## 2023-04-18 DIAGNOSIS — M170 Bilateral primary osteoarthritis of knee: Secondary | ICD-10-CM | POA: Diagnosis not present

## 2023-04-18 DIAGNOSIS — Z139 Encounter for screening, unspecified: Secondary | ICD-10-CM | POA: Diagnosis not present

## 2023-04-18 DIAGNOSIS — H6121 Impacted cerumen, right ear: Secondary | ICD-10-CM | POA: Diagnosis not present

## 2023-05-16 ENCOUNTER — Other Ambulatory Visit (HOSPITAL_COMMUNITY): Payer: Self-pay | Admitting: Gastroenterology

## 2023-05-16 DIAGNOSIS — R1032 Left lower quadrant pain: Secondary | ICD-10-CM | POA: Diagnosis not present

## 2023-05-16 DIAGNOSIS — A084 Viral intestinal infection, unspecified: Secondary | ICD-10-CM | POA: Diagnosis not present

## 2023-05-16 DIAGNOSIS — R194 Change in bowel habit: Secondary | ICD-10-CM | POA: Diagnosis not present

## 2023-05-18 ENCOUNTER — Ambulatory Visit (HOSPITAL_COMMUNITY)
Admission: RE | Admit: 2023-05-18 | Discharge: 2023-05-18 | Disposition: A | Discharge status: Home / Self Care | Source: Ambulatory Visit | Attending: Gastroenterology | Admitting: Gastroenterology

## 2023-05-18 DIAGNOSIS — R1032 Left lower quadrant pain: Secondary | ICD-10-CM | POA: Insufficient documentation

## 2023-05-18 DIAGNOSIS — K56609 Unspecified intestinal obstruction, unspecified as to partial versus complete obstruction: Secondary | ICD-10-CM | POA: Diagnosis not present

## 2023-05-18 DIAGNOSIS — K449 Diaphragmatic hernia without obstruction or gangrene: Secondary | ICD-10-CM | POA: Diagnosis not present

## 2023-05-18 DIAGNOSIS — K5730 Diverticulosis of large intestine without perforation or abscess without bleeding: Secondary | ICD-10-CM | POA: Diagnosis not present

## 2023-05-18 MED ORDER — IOHEXOL 350 MG/ML SOLN
75.0000 mL | Freq: Once | INTRAVENOUS | Status: AC | PRN
  Administered 2023-05-18: 75 mL via INTRAVENOUS

## 2023-05-30 DIAGNOSIS — Z124 Encounter for screening for malignant neoplasm of cervix: Secondary | ICD-10-CM | POA: Diagnosis not present

## 2023-05-30 DIAGNOSIS — R9389 Abnormal findings on diagnostic imaging of other specified body structures: Secondary | ICD-10-CM | POA: Diagnosis not present

## 2023-06-06 DIAGNOSIS — R9389 Abnormal findings on diagnostic imaging of other specified body structures: Secondary | ICD-10-CM | POA: Diagnosis not present

## 2023-06-14 DIAGNOSIS — N840 Polyp of corpus uteri: Secondary | ICD-10-CM | POA: Diagnosis not present

## 2023-06-20 DIAGNOSIS — A084 Viral intestinal infection, unspecified: Secondary | ICD-10-CM | POA: Diagnosis not present

## 2023-06-20 DIAGNOSIS — R194 Change in bowel habit: Secondary | ICD-10-CM | POA: Diagnosis not present

## 2023-08-03 DIAGNOSIS — N921 Excessive and frequent menstruation with irregular cycle: Secondary | ICD-10-CM | POA: Diagnosis not present

## 2023-08-03 DIAGNOSIS — N840 Polyp of corpus uteri: Secondary | ICD-10-CM | POA: Diagnosis not present

## 2023-08-12 DIAGNOSIS — Z4889 Encounter for other specified surgical aftercare: Secondary | ICD-10-CM | POA: Diagnosis not present

## 2023-08-12 DIAGNOSIS — N840 Polyp of corpus uteri: Secondary | ICD-10-CM | POA: Diagnosis not present

## 2023-10-24 DIAGNOSIS — K760 Fatty (change of) liver, not elsewhere classified: Secondary | ICD-10-CM | POA: Diagnosis not present

## 2023-10-24 DIAGNOSIS — I10 Essential (primary) hypertension: Secondary | ICD-10-CM | POA: Diagnosis not present

## 2023-10-24 DIAGNOSIS — Z9181 History of falling: Secondary | ICD-10-CM | POA: Diagnosis not present

## 2023-10-24 DIAGNOSIS — M170 Bilateral primary osteoarthritis of knee: Secondary | ICD-10-CM | POA: Diagnosis not present

## 2024-01-25 DIAGNOSIS — D2371 Other benign neoplasm of skin of right lower limb, including hip: Secondary | ICD-10-CM | POA: Diagnosis not present

## 2024-01-25 DIAGNOSIS — L570 Actinic keratosis: Secondary | ICD-10-CM | POA: Diagnosis not present

## 2024-01-25 DIAGNOSIS — L819 Disorder of pigmentation, unspecified: Secondary | ICD-10-CM | POA: Diagnosis not present

## 2024-01-25 DIAGNOSIS — L821 Other seborrheic keratosis: Secondary | ICD-10-CM | POA: Diagnosis not present
